# Patient Record
Sex: Female | Born: 1970 | Race: Black or African American | Hispanic: No | Marital: Married | Smoking: Never smoker
Health system: Southern US, Community
[De-identification: ages and names within clinical notes are randomized; demographics above are authoritative.]

## PROBLEM LIST (undated history)

## (undated) DIAGNOSIS — G4730 Sleep apnea, unspecified: Secondary | ICD-10-CM

## (undated) DIAGNOSIS — E282 Polycystic ovarian syndrome: Secondary | ICD-10-CM

## (undated) DIAGNOSIS — I10 Essential (primary) hypertension: Secondary | ICD-10-CM

## (undated) DIAGNOSIS — M1990 Unspecified osteoarthritis, unspecified site: Secondary | ICD-10-CM

## (undated) DIAGNOSIS — N943 Premenstrual tension syndrome: Secondary | ICD-10-CM

## (undated) DIAGNOSIS — M2550 Pain in unspecified joint: Secondary | ICD-10-CM

## (undated) DIAGNOSIS — R7303 Prediabetes: Secondary | ICD-10-CM

## (undated) HISTORY — DX: Pain in unspecified joint: M25.50

## (undated) HISTORY — PX: LEEP: SHX91

## (undated) HISTORY — DX: Unspecified osteoarthritis, unspecified site: M19.90

## (undated) HISTORY — PX: BREAST SURGERY: SHX581

## (undated) HISTORY — PX: COLONOSCOPY: SHX174

---

## 1998-10-17 ENCOUNTER — Other Ambulatory Visit: Admission: RE | Admit: 1998-10-17 | Discharge: 1998-10-17 | Payer: Self-pay | Admitting: Obstetrics & Gynecology

## 1999-02-06 ENCOUNTER — Encounter: Payer: Self-pay | Admitting: Emergency Medicine

## 1999-02-06 ENCOUNTER — Emergency Department (HOSPITAL_COMMUNITY): Admission: EM | Admit: 1999-02-06 | Discharge: 1999-02-06 | Payer: Self-pay | Admitting: Emergency Medicine

## 1999-11-20 ENCOUNTER — Other Ambulatory Visit: Admission: RE | Admit: 1999-11-20 | Discharge: 1999-11-20 | Payer: Self-pay | Admitting: Obstetrics & Gynecology

## 2000-12-01 ENCOUNTER — Other Ambulatory Visit: Admission: RE | Admit: 2000-12-01 | Discharge: 2000-12-01 | Payer: Self-pay | Admitting: Obstetrics & Gynecology

## 2001-02-24 ENCOUNTER — Other Ambulatory Visit: Admission: RE | Admit: 2001-02-24 | Discharge: 2001-02-24 | Payer: Self-pay | Admitting: Obstetrics & Gynecology

## 2001-10-27 ENCOUNTER — Other Ambulatory Visit: Admission: RE | Admit: 2001-10-27 | Discharge: 2001-10-27 | Payer: Self-pay | Admitting: Obstetrics and Gynecology

## 2002-11-03 ENCOUNTER — Other Ambulatory Visit: Admission: RE | Admit: 2002-11-03 | Discharge: 2002-11-03 | Payer: Self-pay | Admitting: *Deleted

## 2002-11-29 ENCOUNTER — Encounter: Payer: Self-pay | Admitting: Family Medicine

## 2002-11-29 ENCOUNTER — Ambulatory Visit (HOSPITAL_COMMUNITY): Admission: RE | Admit: 2002-11-29 | Discharge: 2002-11-29 | Payer: Self-pay | Admitting: Family Medicine

## 2003-11-07 ENCOUNTER — Other Ambulatory Visit: Admission: RE | Admit: 2003-11-07 | Discharge: 2003-11-07 | Payer: Self-pay | Admitting: *Deleted

## 2011-09-06 ENCOUNTER — Emergency Department (HOSPITAL_COMMUNITY)
Admission: EM | Admit: 2011-09-06 | Discharge: 2011-09-06 | Disposition: A | Discharge status: Home / Self Care | Payer: BC Managed Care – PPO | Attending: Emergency Medicine | Admitting: Emergency Medicine

## 2011-09-06 ENCOUNTER — Other Ambulatory Visit: Payer: Self-pay

## 2011-09-06 ENCOUNTER — Encounter: Payer: Self-pay | Admitting: Emergency Medicine

## 2011-09-06 DIAGNOSIS — R002 Palpitations: Secondary | ICD-10-CM | POA: Insufficient documentation

## 2011-09-06 DIAGNOSIS — I10 Essential (primary) hypertension: Secondary | ICD-10-CM | POA: Insufficient documentation

## 2011-09-06 HISTORY — DX: Polycystic ovarian syndrome: E28.2

## 2011-09-06 MED ORDER — ACETAMINOPHEN 325 MG PO TABS
975.0000 mg | ORAL_TABLET | Freq: Four times a day (QID) | ORAL | Status: DC | PRN
  Administered 2011-09-06: 975 mg via ORAL
  Filled 2011-09-06: qty 3

## 2011-09-06 NOTE — ED Provider Notes (Signed)
History     CSN: [id number] Arrival date & time: 09/06/2011 12:01 PM   First MD Initiated Contact with Patient 09/06/11 1253      Chief Complaint  Patient presents with  . Hypertension    123/101 @ home  . Palpitations     HPI Patient reports occasional palpitations x1 month.  She reports it feels like a "flutter".  His had no syncope or near-syncope.  She reports these are transient episodes.  Nothing seems to worsen it.  Nothing seems to bring bring on.  Denies herbal supplements.  She denies caffeine or other diet pills.  She denies any recent change in medications.  She's also noted to be hypertensive and her primary care doctor's office who asked her to recheck her blood pressure.  She reportedly was in the 140s over 100 range and thus comes the ER today for evaluation.  At this time she has no palpitations.  She has no chest pain shortness of breath.  She has no orthopnea or lower from edema.  She has no dyspnea on exertion.   Past Medical History  Diagnosis Date  . Diabetes mellitus     Pre diabetic  . PCOS (polycystic ovarian syndrome)     Past Surgical History  Procedure Date  . Breast surgery     Reduction    No family history on file.  History  Substance Use Topics  . Smoking status: Never Smoker   . Smokeless tobacco: Never Used  . Alcohol Use: Yes    OB History    Grav Para Term Preterm Abortions TAB SAB Ect Mult Living                  Review of Systems  All other systems reviewed and are negative.    Allergies  Glucophage and Oxycodone  Home Medications   Current Outpatient Rx  Name Route Sig Dispense Refill  . CALCIUM PO Oral Take 1 tablet by mouth daily.      Marland Kitchen FEXOFENADINE HCL 180 MG PO TABS Oral Take 180 mg by mouth daily.      Marland Kitchen FOLIC ACID PO Oral Take 1 tablet by mouth daily.      Carma Leaven M PLUS PO TABS Oral Take 1 tablet by mouth daily.      Marland Kitchen NORGESTIMATE-ETH ESTRADIOL 0.25-35 MG-MCG PO TABS Oral Take 1 tablet by mouth daily.          BP 144/95  Pulse 92  Temp(Src) 98.4 F (36.9 C) (Oral)  Resp 22  SpO2 99%  LMP 08/10/2011  Physical Exam  Nursing note and vitals reviewed. Constitutional: She is oriented to person, place, and time. She appears well-developed and well-nourished. No distress.  HENT:  Head: Normocephalic and atraumatic.  Eyes: EOM are normal.  Neck: Normal range of motion.  Cardiovascular: Normal rate, regular rhythm and normal heart sounds.   Pulmonary/Chest: Effort normal and breath sounds normal.  Abdominal: Soft. She exhibits no distension. There is no tenderness.  Musculoskeletal: Normal range of motion.  Neurological: She is alert and oriented to person, place, and time.  Skin: Skin is warm and dry.  Psychiatric: Judgment normal.       Anxious appearing    ED Course  Procedures (including critical care time)   ECG  Date: 09/06/2011  Rate: 72  Rhythm: normal sinus rhythm  QRS Axis: normal  Intervals: normal  ST/T Wave abnormalities: normal  Conduction Disutrbances:none  Narrative Interpretation:   Old EKG Reviewed: No  significant changes noted  Telemetry Interpretation: Normal sinus rhythm without ectopy.  I reviewed approximately 20 minutes of telemetry strips on the monitor   Labs Reviewed - No data to display No results found.   1. Palpitations       MDM  Palpitations with normal EKG and normal review of telemetry strips.  Suspect occasional PVCs not caught in the emergency department.  Blood pressure to be managed by her primary care physician and/or cardiologist.  I will refer the patient to cardiology        Lyanne Co, MD 09/06/11 1500

## 2011-09-06 NOTE — ED Notes (Signed)
Pt reports seen at Dr on Tuesday and Blood pressure was elevated. Pt was told to check BP at home. Pt reports palpitations x 1 month. Pt denies chest pain, N/V or dizziness.

## 2012-08-04 ENCOUNTER — Other Ambulatory Visit: Payer: Self-pay | Admitting: Radiology

## 2014-11-07 ENCOUNTER — Ambulatory Visit (INDEPENDENT_AMBULATORY_CARE_PROVIDER_SITE_OTHER): Payer: BLUE CROSS/BLUE SHIELD | Admitting: Podiatry

## 2014-11-07 ENCOUNTER — Encounter: Payer: Self-pay | Admitting: Podiatry

## 2014-11-07 VITALS — BP 137/67 | HR 74 | Resp 16

## 2014-11-07 DIAGNOSIS — B351 Tinea unguium: Secondary | ICD-10-CM | POA: Diagnosis not present

## 2014-11-07 NOTE — Progress Notes (Signed)
   Subjective:    Patient ID: Brittany Mills, female    DOB: January 04, 1971, 44 y.o.   MRN: [id number]  HPI Comments: "I have a toenail that is dark"  Patient c/o tender 1st and 2nd toenails right for several months. The nails are dark and thick. The 2nd nail keeps falling off. No home treatment.     Review of Systems  Skin:       Change in nails  All other systems reviewed and are negative.      Objective:   Physical Exam: I have reviewed past medical history medications allergy surgery social history and review of systems. Pulses are strongly palpable bilateral. Neurologic sensorium is intact for Semmes-Weinstein monofilament. Deep tendon reflexes are intact bilateral muscle strength +5 over 5 dorsiflexion plantar flexors and inverters everters onto the musculature is intact. Orthopedic evaluation and states all joints distal to the ankle, full range of motion without crepitation. Cutaneous evaluation of Mr. supple well-hydrated cutis nail dystrophy to the hallux left and second digit of the right foot demonstrates a nail avulsion more than likely secondary to trauma. The hallux right appears to have a nail dystrophy with onychomycosis distally as well as does the left.        Assessment & Plan:  Assessment: Probable onychomycosis with onychodystrophy.  Plan: Nail samples of the hallux bilaterally were taken today and we will send these for pathology. We will notify her once the path report returns.

## 2014-11-07 NOTE — Patient Instructions (Signed)

## 2014-11-23 ENCOUNTER — Encounter: Payer: Self-pay | Admitting: Podiatry

## 2014-11-30 ENCOUNTER — Ambulatory Visit (INDEPENDENT_AMBULATORY_CARE_PROVIDER_SITE_OTHER): Payer: BLUE CROSS/BLUE SHIELD | Admitting: Podiatry

## 2014-11-30 ENCOUNTER — Encounter: Payer: Self-pay | Admitting: Podiatry

## 2014-11-30 VITALS — BP 136/78 | HR 77 | Resp 16

## 2014-11-30 DIAGNOSIS — B351 Tinea unguium: Secondary | ICD-10-CM

## 2014-11-30 DIAGNOSIS — Z79899 Other long term (current) drug therapy: Secondary | ICD-10-CM

## 2014-11-30 LAB — HEPATIC FUNCTION PANEL
ALT: 23 U/L (ref 0–35)
AST: 17 U/L (ref 0–37)
Albumin: 3.7 g/dL (ref 3.5–5.2)
Alkaline Phosphatase: 36 U/L — ABNORMAL LOW (ref 39–117)
BILIRUBIN DIRECT: 0.1 mg/dL (ref 0.0–0.3)
BILIRUBIN INDIRECT: 0.2 mg/dL (ref 0.2–1.2)
BILIRUBIN TOTAL: 0.3 mg/dL (ref 0.2–1.2)
Total Protein: 6.4 g/dL (ref 6.0–8.3)

## 2014-11-30 MED ORDER — TERBINAFINE HCL 250 MG PO TABS: 250.0000 mg | ORAL_TABLET | Freq: Every day | ORAL | Status: DC

## 2014-11-30 NOTE — Patient Instructions (Signed)

## 2014-12-02 NOTE — Progress Notes (Signed)
She returns today for discussion of her pathology report which confirms onychomycosis to the bilateral toenails.  Objective: Onychomycosis bilateral.  Assessment: Onychomycosis.  Plan: Started her on Lamisil today 250 mg tablets 1 by mouth daily. We sent her out for a liver profile and CBC. Should this come back abnormal I'll notify her immediately. Otherwise I'll follow up with her in 1 month for her second dose of Lamisil.

## 2014-12-04 NOTE — Progress Notes (Signed)
Called patient-L/M- labs WNL and okay to continue medication.

## 2014-12-05 ENCOUNTER — Ambulatory Visit: Payer: BLUE CROSS/BLUE SHIELD | Admitting: Podiatry

## 2014-12-06 ENCOUNTER — Telehealth: Payer: Self-pay | Admitting: *Deleted

## 2014-12-06 NOTE — Telephone Encounter (Signed)
I called and informed the patient, Dr. Milinda Pointer said your bloodwork is good.  "Good, thank you."

## 2014-12-28 ENCOUNTER — Ambulatory Visit (INDEPENDENT_AMBULATORY_CARE_PROVIDER_SITE_OTHER): Payer: BLUE CROSS/BLUE SHIELD | Admitting: Podiatry

## 2014-12-28 ENCOUNTER — Encounter: Payer: Self-pay | Admitting: Podiatry

## 2014-12-28 VITALS — BP 136/65 | HR 73 | Resp 16

## 2014-12-28 DIAGNOSIS — B351 Tinea unguium: Secondary | ICD-10-CM | POA: Diagnosis not present

## 2014-12-28 LAB — HEPATIC FUNCTION PANEL
ALBUMIN: 4 g/dL (ref 3.5–5.2)
ALK PHOS: 38 U/L — AB (ref 39–117)
ALT: 21 U/L (ref 0–35)
AST: 17 U/L (ref 0–37)
BILIRUBIN DIRECT: 0.1 mg/dL (ref 0.0–0.3)
BILIRUBIN TOTAL: 0.3 mg/dL (ref 0.2–1.2)
Indirect Bilirubin: 0.2 mg/dL (ref 0.2–1.2)
TOTAL PROTEIN: 6.3 g/dL (ref 6.0–8.3)

## 2014-12-28 MED ORDER — FLUCONAZOLE 150 MG PO TABS: ORAL_TABLET | ORAL | Status: DC

## 2014-12-28 MED ORDER — TERBINAFINE HCL 250 MG PO TABS: 250.0000 mg | ORAL_TABLET | Freq: Every day | ORAL | Status: DC

## 2014-12-29 NOTE — Progress Notes (Signed)
She presents today for follow-up of her Lamisil therapy. She states that she's had a little bit of a rash on her belly on her forearms as well she says it really doesn't H but is just pops up there when she's been taking the Lamisil. She also states that she had a vaginal candidiasis from the medication as well. However I do feel that more than likely this was due to the antibiotics that she was taking prior to his placing her on Lamisil.  Objective: Vital signs are stable she's alert and oriented 3. Pulses are palpable and no nail plate changes as of yet.  Plan: Wrote her a prescription for Diflucan and a prescription for another 90 days of Lamisil. We also requested more blood work. I suggested that if this rash continues she should start to 50 mg of diphenhydramine daily. Should the rash worsened she will notify us immediately.

## 2014-12-29 NOTE — Progress Notes (Signed)
Called patient - informed her of normal labs.

## 2015-01-02 ENCOUNTER — Telehealth: Payer: Self-pay

## 2015-01-02 NOTE — Telephone Encounter (Signed)
Called pt to leave advise to continue with benadryl and wait 3 to 4 weeks for rash to resolve before starting back on the lamisil, left a voice mail for patient to return call

## 2015-05-03 ENCOUNTER — Ambulatory Visit: Payer: BLUE CROSS/BLUE SHIELD | Admitting: Podiatry

## 2015-05-03 ENCOUNTER — Ambulatory Visit (INDEPENDENT_AMBULATORY_CARE_PROVIDER_SITE_OTHER): Payer: BLUE CROSS/BLUE SHIELD | Admitting: Podiatry

## 2015-05-03 DIAGNOSIS — B351 Tinea unguium: Secondary | ICD-10-CM

## 2015-05-03 NOTE — Progress Notes (Signed)
Subjective:     Patient ID: Brittany Mills, female   DOB: [birth year], 44 y.o.   MRN: [id number]  HPIThis patient returns for her funfal infected great nails.  She has been treated by Dr. Milinda Pointer who determined she had positive fungal infection.  He treated her with lamisil which she turned out to be allergic.  Then she was written for Diflucan which she never took.  She says her right big toenail is painful at the cutucle and desires evaluation and treatment.   Review of Systems     Objective:   Physical Exam Objective: Review of past medical history, medications, social history and allergies were performed.  Vascular: Dorsalis pedis and posterior tibial pulses were palpable B/L, capillary refill was  WNL B/L, temperature gradient was WNL B/L   Skin:  No signs of symptoms of infection or ulcers on both feet  Nails: thick mycotic nails both hallux toenails  Sensory: Semmes Weinstein monifilament WNL   Orthopedic: Orthopedic evaluation demonstrates all joints distal t ankle have full ROM without crepitus, muscle power WNL B/L     Assessment:     Onychomycosis     Plan:     ROV.  Discussed with her about her fungal infected nails.  We decided to stop trying to treat her nails with medicine p.o.  Debrided the avulsed nail at distal aspect right hallux.

## 2015-07-19 ENCOUNTER — Other Ambulatory Visit: Payer: Self-pay | Admitting: Podiatry

## 2017-02-02 DIAGNOSIS — E282 Polycystic ovarian syndrome: Secondary | ICD-10-CM | POA: Diagnosis not present

## 2017-02-02 DIAGNOSIS — E669 Obesity, unspecified: Secondary | ICD-10-CM | POA: Diagnosis not present

## 2017-02-09 DIAGNOSIS — M2550 Pain in unspecified joint: Secondary | ICD-10-CM | POA: Diagnosis not present

## 2017-02-09 DIAGNOSIS — R635 Abnormal weight gain: Secondary | ICD-10-CM | POA: Diagnosis not present

## 2017-02-09 DIAGNOSIS — R21 Rash and other nonspecific skin eruption: Secondary | ICD-10-CM | POA: Diagnosis not present

## 2017-02-09 DIAGNOSIS — I10 Essential (primary) hypertension: Secondary | ICD-10-CM | POA: Diagnosis not present

## 2017-02-09 DIAGNOSIS — E669 Obesity, unspecified: Secondary | ICD-10-CM | POA: Diagnosis not present

## 2017-02-09 DIAGNOSIS — E282 Polycystic ovarian syndrome: Secondary | ICD-10-CM | POA: Diagnosis not present

## 2017-02-10 DIAGNOSIS — Z803 Family history of malignant neoplasm of breast: Secondary | ICD-10-CM | POA: Diagnosis not present

## 2017-02-10 DIAGNOSIS — Z09 Encounter for follow-up examination after completed treatment for conditions other than malignant neoplasm: Secondary | ICD-10-CM | POA: Diagnosis not present

## 2017-02-11 DIAGNOSIS — Z6841 Body Mass Index (BMI) 40.0 and over, adult: Secondary | ICD-10-CM | POA: Diagnosis not present

## 2017-02-11 DIAGNOSIS — Z01419 Encounter for gynecological examination (general) (routine) without abnormal findings: Secondary | ICD-10-CM | POA: Diagnosis not present

## 2017-02-11 DIAGNOSIS — Z1151 Encounter for screening for human papillomavirus (HPV): Secondary | ICD-10-CM | POA: Diagnosis not present

## 2017-02-11 DIAGNOSIS — N870 Mild cervical dysplasia: Secondary | ICD-10-CM | POA: Diagnosis not present

## 2017-03-04 DIAGNOSIS — N920 Excessive and frequent menstruation with regular cycle: Secondary | ICD-10-CM | POA: Diagnosis not present

## 2017-03-04 DIAGNOSIS — N946 Dysmenorrhea, unspecified: Secondary | ICD-10-CM | POA: Diagnosis not present

## 2017-03-04 DIAGNOSIS — D250 Submucous leiomyoma of uterus: Secondary | ICD-10-CM | POA: Diagnosis not present

## 2017-03-20 DIAGNOSIS — M1711 Unilateral primary osteoarthritis, right knee: Secondary | ICD-10-CM | POA: Diagnosis not present

## 2017-03-20 DIAGNOSIS — M25569 Pain in unspecified knee: Secondary | ICD-10-CM | POA: Diagnosis not present

## 2017-03-20 DIAGNOSIS — M25562 Pain in left knee: Secondary | ICD-10-CM | POA: Diagnosis not present

## 2017-03-20 DIAGNOSIS — R768 Other specified abnormal immunological findings in serum: Secondary | ICD-10-CM | POA: Diagnosis not present

## 2017-03-20 DIAGNOSIS — M25561 Pain in right knee: Secondary | ICD-10-CM | POA: Diagnosis not present

## 2017-03-20 DIAGNOSIS — M25529 Pain in unspecified elbow: Secondary | ICD-10-CM | POA: Diagnosis not present

## 2017-03-20 DIAGNOSIS — M7710 Lateral epicondylitis, unspecified elbow: Secondary | ICD-10-CM | POA: Diagnosis not present

## 2017-03-20 DIAGNOSIS — M545 Low back pain: Secondary | ICD-10-CM | POA: Diagnosis not present

## 2017-03-25 ENCOUNTER — Other Ambulatory Visit: Payer: Self-pay | Admitting: Obstetrics and Gynecology

## 2017-03-27 NOTE — Patient Instructions (Addendum)
Your procedure is scheduled on:  Thursday, June 21  Enter through the [employer] [facility] at: 10 AM  Pick up the phone at the desk and dial [phone].  Call this number if you have problems the morning of surgery:[phone].  Remember: Do NOT eat or drink (including water) after midnight Wednesday  Take these medicines the morning of surgery with a SIP OF WATER:  None  So not smoke on day of surgery.  Stop all herbal medications and supplements at this time.  Do NOT wear jewelry (body piercing), metal hair clips/bobby pins, make-up, or nail polish. Do NOT wear lotions, powders, or perfumes.  You may wear deoderant. Do NOT shave for 48 hours prior to surgery. Do NOT bring valuables to the hospital.  Have a responsible adult drive you home and stay with you for 24 hours after your procedure. Home with husband Brittany Mills cell [phone]

## 2017-04-01 ENCOUNTER — Encounter (HOSPITAL_COMMUNITY): Payer: Self-pay

## 2017-04-01 ENCOUNTER — Other Ambulatory Visit: Payer: Self-pay

## 2017-04-01 ENCOUNTER — Encounter (HOSPITAL_COMMUNITY)
Admission: RE | Admit: 2017-04-01 | Discharge: 2017-04-01 | Disposition: A | Discharge status: Home / Self Care | Payer: BLUE CROSS/BLUE SHIELD | Source: Ambulatory Visit | Attending: Obstetrics and Gynecology | Admitting: Obstetrics and Gynecology

## 2017-04-01 DIAGNOSIS — Z01818 Encounter for other preprocedural examination: Secondary | ICD-10-CM | POA: Diagnosis not present

## 2017-04-01 HISTORY — DX: Prediabetes: R73.03

## 2017-04-01 HISTORY — DX: Unspecified osteoarthritis, unspecified site: M19.90

## 2017-04-01 HISTORY — DX: Essential (primary) hypertension: I10

## 2017-04-01 HISTORY — DX: Premenstrual tension syndrome: N94.3

## 2017-04-01 LAB — CBC
HCT: 40.1 % (ref 36.0–46.0)
HEMOGLOBIN: 12.9 g/dL (ref 12.0–15.0)
MCH: 26.4 pg (ref 26.0–34.0)
MCHC: 32.2 g/dL (ref 30.0–36.0)
MCV: 82 fL (ref 78.0–100.0)
PLATELETS: 210 10*3/uL (ref 150–400)
RBC: 4.89 MIL/uL (ref 3.87–5.11)
RDW: 13.9 % (ref 11.5–15.5)
WBC: 6.7 10*3/uL (ref 4.0–10.5)

## 2017-04-01 LAB — BASIC METABOLIC PANEL
Anion gap: 6 (ref 5–15)
BUN: 15 mg/dL (ref 6–20)
CHLORIDE: 105 mmol/L (ref 101–111)
CO2: 26 mmol/L (ref 22–32)
Calcium: 9 mg/dL (ref 8.9–10.3)
Creatinine, Ser: 0.76 mg/dL (ref 0.44–1.00)
GFR calc Af Amer: >60 mL/min (ref >60)
GFR calc non Af Amer: >60 mL/min (ref >60)
Glucose, Bld: 74 mg/dL (ref 65–99)
POTASSIUM: 3.8 mmol/L (ref 3.5–5.1)
SODIUM: 137 mmol/L (ref 135–145)

## 2017-04-01 NOTE — Pre-Procedure Instructions (Signed)
Reviewed EKG with anesthesia Dr. Marcell Barlow.  No orders given. [place] for surgery.

## 2017-04-03 DIAGNOSIS — M545 Low back pain: Secondary | ICD-10-CM | POA: Diagnosis not present

## 2017-04-09 ENCOUNTER — Ambulatory Visit (HOSPITAL_COMMUNITY)
Admission: RE | Admit: 2017-04-09 | Discharge: 2017-04-09 | Disposition: A | Discharge status: Home / Self Care | Payer: BLUE CROSS/BLUE SHIELD | Source: Ambulatory Visit | Attending: Obstetrics and Gynecology | Admitting: Obstetrics and Gynecology

## 2017-04-09 ENCOUNTER — Encounter (HOSPITAL_COMMUNITY): Payer: Self-pay | Admitting: *Deleted

## 2017-04-09 ENCOUNTER — Ambulatory Visit (HOSPITAL_COMMUNITY): Payer: BLUE CROSS/BLUE SHIELD | Admitting: Anesthesiology

## 2017-04-09 ENCOUNTER — Encounter (HOSPITAL_COMMUNITY)
Admission: RE | Disposition: A | Discharge status: Home / Self Care | Payer: Self-pay | Source: Ambulatory Visit | Attending: Obstetrics and Gynecology

## 2017-04-09 DIAGNOSIS — D250 Submucous leiomyoma of uterus: Secondary | ICD-10-CM | POA: Diagnosis not present

## 2017-04-09 DIAGNOSIS — N920 Excessive and frequent menstruation with regular cycle: Secondary | ICD-10-CM | POA: Insufficient documentation

## 2017-04-09 DIAGNOSIS — M1990 Unspecified osteoarthritis, unspecified site: Secondary | ICD-10-CM | POA: Insufficient documentation

## 2017-04-09 DIAGNOSIS — E282 Polycystic ovarian syndrome: Secondary | ICD-10-CM | POA: Diagnosis not present

## 2017-04-09 DIAGNOSIS — Z885 Allergy status to narcotic agent status: Secondary | ICD-10-CM | POA: Diagnosis not present

## 2017-04-09 DIAGNOSIS — N946 Dysmenorrhea, unspecified: Secondary | ICD-10-CM | POA: Diagnosis not present

## 2017-04-09 DIAGNOSIS — I10 Essential (primary) hypertension: Secondary | ICD-10-CM | POA: Diagnosis not present

## 2017-04-09 DIAGNOSIS — R7303 Prediabetes: Secondary | ICD-10-CM | POA: Insufficient documentation

## 2017-04-09 DIAGNOSIS — Z91040 Latex allergy status: Secondary | ICD-10-CM | POA: Diagnosis not present

## 2017-04-09 HISTORY — PX: DILATATION & CURETTAGE/HYSTEROSCOPY WITH MYOSURE: SHX6511

## 2017-04-09 SURGERY — DILATATION & CURETTAGE/HYSTEROSCOPY WITH MYOSURE
Anesthesia: General

## 2017-04-09 MED ORDER — DEXAMETHASONE SODIUM PHOSPHATE 10 MG/ML IJ SOLN
INTRAMUSCULAR | Status: AC
  Filled 2017-04-09: qty 1

## 2017-04-09 MED ORDER — LACTATED RINGERS IR SOLN
Status: DC | PRN
  Administered 2017-04-09: 3000 mL

## 2017-04-09 MED ORDER — LIDOCAINE HCL (CARDIAC) 20 MG/ML IV SOLN
INTRAVENOUS | Status: AC
  Filled 2017-04-09: qty 5

## 2017-04-09 MED ORDER — PROPOFOL 10 MG/ML IV BOLUS
INTRAVENOUS | Status: DC | PRN
  Administered 2017-04-09: 200 mg via INTRAVENOUS

## 2017-04-09 MED ORDER — FENTANYL CITRATE (PF) 100 MCG/2ML IJ SOLN
INTRAMUSCULAR | Status: DC | PRN
Start: 2017-04-09 — End: 2017-04-09
  Administered 2017-04-09 (×2): 50 ug via INTRAVENOUS
  Administered 2017-04-09: 100 ug via INTRAVENOUS
  Administered 2017-04-09: 50 ug via INTRAVENOUS

## 2017-04-09 MED ORDER — LACTATED RINGERS IV SOLN: INTRAVENOUS | Status: DC

## 2017-04-09 MED ORDER — PROPOFOL 10 MG/ML IV BOLUS
INTRAVENOUS | Status: AC
  Filled 2017-04-09: qty 20

## 2017-04-09 MED ORDER — HYDROCODONE-ACETAMINOPHEN 5-325 MG PO TABS: 1.0000 | ORAL_TABLET | Freq: Four times a day (QID) | ORAL | 0 refills | Status: DC | PRN

## 2017-04-09 MED ORDER — GLYCOPYRROLATE 0.2 MG/ML IJ SOLN
INTRAMUSCULAR | Status: AC
  Filled 2017-04-09: qty 4

## 2017-04-09 MED ORDER — MIDAZOLAM HCL 2 MG/2ML IJ SOLN
INTRAMUSCULAR | Status: AC
  Filled 2017-04-09: qty 2

## 2017-04-09 MED ORDER — FENTANYL CITRATE (PF) 250 MCG/5ML IJ SOLN
INTRAMUSCULAR | Status: AC
  Filled 2017-04-09: qty 5

## 2017-04-09 MED ORDER — PROMETHAZINE HCL 25 MG/ML IJ SOLN: 6.2500 mg | INTRAMUSCULAR | Status: DC | PRN

## 2017-04-09 MED ORDER — NEOSTIGMINE METHYLSULFATE 10 MG/10ML IV SOLN
INTRAVENOUS | Status: AC
  Filled 2017-04-09: qty 1

## 2017-04-09 MED ORDER — MIDAZOLAM HCL 5 MG/5ML IJ SOLN
INTRAMUSCULAR | Status: DC | PRN
  Administered 2017-04-09: 2 mg via INTRAVENOUS

## 2017-04-09 MED ORDER — MEPERIDINE HCL 25 MG/ML IJ SOLN: 6.2500 mg | INTRAMUSCULAR | Status: DC | PRN

## 2017-04-09 MED ORDER — KETOROLAC TROMETHAMINE 30 MG/ML IJ SOLN
INTRAMUSCULAR | Status: AC
  Filled 2017-04-09: qty 1

## 2017-04-09 MED ORDER — FENTANYL CITRATE (PF) 100 MCG/2ML IJ SOLN: 25.0000 ug | INTRAMUSCULAR | Status: DC | PRN

## 2017-04-09 MED ORDER — LIDOCAINE HCL (CARDIAC) 20 MG/ML IV SOLN
INTRAVENOUS | Status: DC | PRN
  Administered 2017-04-09: 50 mg via INTRAVENOUS

## 2017-04-09 MED ORDER — DEXAMETHASONE SODIUM PHOSPHATE 10 MG/ML IJ SOLN
INTRAMUSCULAR | Status: DC | PRN
  Administered 2017-04-09: 4 mg via INTRAVENOUS

## 2017-04-09 MED ORDER — LACTATED RINGERS IV SOLN
INTRAVENOUS | Status: DC
  Administered 2017-04-09: 11:00:00 via INTRAVENOUS

## 2017-04-09 MED ORDER — KETOROLAC TROMETHAMINE 30 MG/ML IJ SOLN
INTRAMUSCULAR | Status: DC | PRN
  Administered 2017-04-09: 30 mg via INTRAVENOUS

## 2017-04-09 MED ORDER — SCOPOLAMINE 1 MG/3DAYS TD PT72
1.0000 | MEDICATED_PATCH | Freq: Once | TRANSDERMAL | Status: DC
  Administered 2017-04-09: 1.5 mg via TRANSDERMAL

## 2017-04-09 MED ORDER — ONDANSETRON HCL 4 MG/2ML IJ SOLN
INTRAMUSCULAR | Status: DC | PRN
  Administered 2017-04-09: 4 mg via INTRAVENOUS

## 2017-04-09 MED ORDER — SCOPOLAMINE 1 MG/3DAYS TD PT72
MEDICATED_PATCH | TRANSDERMAL | Status: AC
  Administered 2017-04-09: 1.5 mg via TRANSDERMAL
  Filled 2017-04-09: qty 1

## 2017-04-09 MED ORDER — ONDANSETRON HCL 4 MG/2ML IJ SOLN
INTRAMUSCULAR | Status: AC
  Filled 2017-04-09: qty 2

## 2017-04-09 SURGICAL SUPPLY — 18 items
CANISTER SUCT 3000ML PPV (MISCELLANEOUS) ×3 IMPLANT
CATH FOLEY LATEX FREE 14FR (CATHETERS) ×2
CATH FOLEY LF 14FR (CATHETERS) ×1 IMPLANT
CLOTH BEACON ORANGE TIMEOUT ST (SAFETY) ×3 IMPLANT
CONTAINER PREFILL 10% NBF 60ML (FORM) ×6 IMPLANT
DEVICE MYOSURE LITE (MISCELLANEOUS) IMPLANT
DEVICE MYOSURE REACH (MISCELLANEOUS) IMPLANT
FILTER ARTHROSCOPY CONVERTOR (FILTER) ×3 IMPLANT
GLOVE BIOGEL PI IND STRL 7.0 (GLOVE) ×2 IMPLANT
GLOVE BIOGEL PI INDICATOR 7.0 (GLOVE) ×4
GLOVE SURG SS PI 6.5 STRL IVOR (GLOVE) ×6 IMPLANT
GOWN STRL REUS W/TWL LRG LVL3 (GOWN DISPOSABLE) ×6 IMPLANT
PACK VAGINAL MINOR WOMEN LF (CUSTOM PROCEDURE TRAY) ×3 IMPLANT
PAD OB MATERNITY 4.3X12.25 (PERSONAL CARE ITEMS) ×3 IMPLANT
SEAL ROD LENS SCOPE MYOSURE (ABLATOR) ×3 IMPLANT
TOWEL OR 17X24 6PK STRL BLUE (TOWEL DISPOSABLE) ×6 IMPLANT
TUBING AQUILEX INFLOW (TUBING) ×3 IMPLANT
TUBING AQUILEX OUTFLOW (TUBING) ×3 IMPLANT

## 2017-04-09 NOTE — Anesthesia Preprocedure Evaluation (Addendum)
Anesthesia Evaluation  Patient identified by MRN, date of birth, ID band Patient awake    Reviewed: Allergy & Precautions, NPO status , Patient's Chart, lab work & pertinent test results  Airway Mallampati: III  TM Distance: >3 FB Neck ROM: Full    Dental  (+) Teeth Intact, Dental Advisory Given   Pulmonary neg pulmonary ROS,    breath sounds clear to auscultation       Cardiovascular hypertension, Pt. on medications negative cardio ROS   Rhythm:Regular Rate:Normal     Neuro/Psych negative neurological ROS  negative psych ROS   GI/Hepatic negative GI ROS, Neg liver ROS,   Endo/Other  negative endocrine ROS  Renal/GU negative Renal ROS  negative genitourinary   Musculoskeletal  (+) Arthritis , Osteoarthritis,    Abdominal (+) + obese,   Peds negative pediatric ROS (+)  Hematology negative hematology ROS (+)   Anesthesia Other Findings Day of surgery medications reviewed with the patient.  Reproductive/Obstetrics negative OB ROS                            Lab Results  Component Value Date   WBC 6.7 04/01/2017   HGB 12.9 04/01/2017   HCT 40.1 04/01/2017   MCV 82.0 04/01/2017   PLT 210 04/01/2017   Lab Results  Component Value Date   CREATININE 0.76 04/01/2017   BUN 15 04/01/2017   NA 137 04/01/2017   K 3.8 04/01/2017   CL 105 04/01/2017   CO2 26 04/01/2017   No results found for: INR, PROTIME  03/2017 EKG: NSR  Anesthesia Physical Anesthesia Plan  ASA: III  Anesthesia Plan: General   Post-op Pain Management:    Induction: Intravenous  PONV Risk Score and Plan: 4 or greater and Ondansetron, Dexamethasone, Propofol, Midazolam and Scopolamine patch - Pre-op  Airway Management Planned: LMA  Additional Equipment:   Intra-op Plan:   Post-operative Plan: Extubation in OR  Informed Consent: I have reviewed the patients History and Physical, chart, labs and discussed  the procedure including the risks, benefits and alternatives for the proposed anesthesia with the patient or authorized representative who has indicated his/her understanding and acceptance.   Dental advisory given  Plan Discussed with: CRNA  Anesthesia Plan Comments:         Anesthesia Quick Evaluation

## 2017-04-09 NOTE — Anesthesia Procedure Notes (Signed)
Procedure Name: LMA Insertion Date/Time: 04/09/2017 11:54 AM Performed by: Barkley Boards L Pre-anesthesia Checklist: Patient identified, Patient being monitored, Emergency Drugs available, Timeout performed and Suction available Patient Re-evaluated:Patient Re-evaluated prior to inductionOxygen Delivery Method: Circle System Utilized Preoxygenation: Pre-oxygenation with 100% oxygen Intubation Type: IV induction Ventilation: Mask ventilation without difficulty LMA: LMA inserted LMA Size: 4.0 Number of attempts: 1 Placement Confirmation: positive ETCO2 and breath sounds checked- equal and bilateral Dental Injury: Teeth and Oropharynx as per pre-operative assessment

## 2017-04-09 NOTE — Transfer of Care (Signed)
Immediate Anesthesia Transfer of Care Note  Patient: Brittany Mills  Procedure(s) Performed: Procedure(s): DILATATION & CURETTAGE/HYSTEROSCOPY WITH MYOSURE (N/A)  Patient Location: PACU  Anesthesia Type:General  Level of Consciousness: sedated  Airway & Oxygen Therapy: Patient Spontanous Breathing and Patient connected to nasal cannula oxygen  Post-op Assessment: Report given to RN and Post -op Vital signs reviewed and stable  Post vital signs: stable  Last Vitals:  Vitals:   04/09/17 1021  BP: (!) 131/92  Pulse: 68  Resp: 16  Temp: 36.9 C    Last Pain:  Vitals:   04/09/17 1021  TempSrc: Oral      Patients Stated Pain Goal: 3 (53/96/72 8979)  Complications: No apparent anesthesia complications

## 2017-04-09 NOTE — Op Note (Signed)
NAMEORETTA, BERKLAND               ACCOUNT NO.:  [id number]  MEDICAL RECORD NO.:  [id number]  LOCATION:  PERIO                         FACILITY:  [facility]  PHYSICIAN:  Servando Salina, M.D.DATE OF BIRTH:  [birth year]  DATE OF PROCEDURE:  04/09/2017 DATE OF DISCHARGE:                              OPERATIVE REPORT   PREOPERATIVE DIAGNOSES: 1. Submucosal fibroid. 2. Dysmenorrhea with menorrhagia.  PROCEDURES: 1. Diagnostic hysteroscopy. 2. Hysteroscopic resection of submucosal fibroid using MyoSure. 3. Dilation and curettage.  POSTOPERATIVE DIAGNOSES: 1. Submucosal fibroid. 2. Dysmenorrhea with menorrhagia.  ANESTHESIA:  General.  SURGEON:  Servando Salina, M.D.  ASSISTANT:  None.  DESCRIPTION OF PROCEDURE:  Under adequate general anesthesia, the patient was placed in dorsal lithotomy position.  She was sterilely prepped and draped in usual fashion.  The bladder was catheterized for moderate amount of urine.  Examination under anesthesia revealed anteverted uterus.  No adnexal masses could be appreciated.  Bivalve speculum was placed in the vagina.  Single-tooth tenaculum was placed on the anterior lip of the cervix.  The cervix was dilated up to #[facility] dilator.  Diagnostic hysteroscope was inserted into the uterine cavity. Both tubal ostia could be seen.  Posterior submucosal fibroid was noted. Using the extra blade MyoSure resectoscope, the fibroid was resected down to the level of the endometrium.  No other lesions were noted.  The resectoscope was removed.  The cavity was gently curetted for scant amount of tissue.  All instruments were then removed from the vagina.  SPECIMEN:  Labeled endometrial curetting with submucosal fibroid resections were sent to Pathology.  FLUID DEFICIT:  440 ml.  ESTIMATED BLOOD LOSS:  10 mL.  COMPLICATIONS:  None.  DISPOSITION:  The patient tolerated the procedure well, was transferred to recovery in stable  condition.     Servando Salina, M.D.     [place]/MEDQ  D:  04/09/2017  T:  04/09/2017  Job:  [id number]

## 2017-04-09 NOTE — Brief Op Note (Signed)
04/09/2017  12:30 PM  PATIENT:  Brittany Mills  46 y.o. female  PRE-OPERATIVE DIAGNOSIS:  Submucosal Fibroid, dysmenorrhea, menorrhagia with regular cycles  POST-OPERATIVE DIAGNOSIS:  Submucosal Fibroid, dysmenorrhea, menorrhagia with regular cycles  PROCEDURE:  Diagnostic hysteroscopy, dilation and curettage, hysteroscopic resection of SM fibroid  SURGEON:  Surgeon(s) and Role:    * Servando Salina, MD - Primary  PHYSICIAN ASSISTANT:   ASSISTANTS: none   ANESTHESIA:   general Findings: post SM fibroid, nl endocervical canal, tubal ostia seen EBL:  Total I/O In: 400 [I.V.:400] Out: 60 [Urine:50; Blood:10]  BLOOD ADMINISTERED:none  DRAINS: none   LOCAL MEDICATIONS USED:  NONE  SPECIMEN:  Source of Specimen:  EMC with fibroid resection  DISPOSITION OF SPECIMEN:  PATHOLOGY  COUNTS:  YES  TOURNIQUET:  * No tourniquets in log *  DICTATION: .Other Dictation: Dictation Number [id number]  PLAN OF CARE: Discharge to home after PACU  PATIENT DISPOSITION:  PACU - hemodynamically stable.   Delay start of Pharmacological VTE agent (>24hrs) due to surgical blood loss or risk of bleeding: no

## 2017-04-09 NOTE — Discharge Instructions (Addendum)
CALL  IF TEMP>100.4, NOTHING PER VAGINA X 2 WK, CALL IF SOAKING A MAXI  PAD EVERY HOUR OR MORE FREQUENT  DISCHARGE INSTRUCTIONS: HYSTEROSCOPY / ENDOMETRIAL ABLATION The following instructions have been prepared to help you care for yourself upon your return home.  May Remove Scop patch on or before  May take Ibuprofen after: 6:15 today  May take stool softner while taking narcotic pain medication to prevent constipation.  Drink plenty of water.  Personal hygiene:  Use sanitary pads for vaginal drainage, not tampons.  Shower the day after your procedure.  NO tub baths, pools or Jacuzzis for 2-3 weeks.  Wipe front to back after using the bathroom.  Activity and limitations:  Do NOT drive or operate any equipment for 24 hours. The effects of anesthesia are still present and drowsiness may result.  Do NOT rest in bed all day.  Walking is encouraged.  Walk up and down stairs slowly.  You may resume your normal activity in one to two days or as indicated by your physician. Sexual activity: NO intercourse for at least 2 weeks after the procedure, or as indicated by your Doctor.  Diet: Eat a light meal as desired this evening. You may resume your usual diet tomorrow.  Return to Work: You may resume your work activities in one to two days or as indicated by Marine scientist.  What to expect after your surgery: Expect to have vaginal bleeding/discharge for 2-3 days and spotting for up to 10 days. It is not unusual to have soreness for up to 1-2 weeks. You may have a slight burning sensation when you urinate for the first day. Mild cramps may continue for a couple of days. You may have a regular period in 2-6 weeks.  Call your doctor for any of the following:  Excessive vaginal bleeding or clotting, saturating and changing one pad every hour.  Inability to urinate 6 hours after discharge from hospital.  Pain not relieved by pain medication.  Fever of 100.4 F or greater.   Unusual vaginal discharge or odor.  Return to office _________________Call for an appointment ___________________ Patients signature: ______________________ Nurses signature ________________________  Post Anesthesia Care Unit [phone]  Post Anesthesia Home Care Instructions  Activity: Get plenty of rest for the remainder of the day. A responsible individual must stay with you for 24 hours following the procedure.  For the next 24 hours, DO NOT: -Drive a car -Paediatric nurse -Drink alcoholic beverages -Take any medication unless instructed by your physician -Make any legal decisions or sign important papers.  Meals: Start with liquid foods such as gelatin or soup. Progress to regular foods as tolerated. Avoid greasy, spicy, heavy foods. If nausea and/or vomiting occur, drink only clear liquids until the nausea and/or vomiting subsides. Call your physician if vomiting continues.  Special Instructions/Symptoms: Your throat may feel dry or sore from the anesthesia or the breathing tube placed in your throat during surgery. If this causes discomfort, gargle with warm salt water. The discomfort should disappear within 24 hours.  If you had a scopolamine patch placed behind your ear for the management of post- operative nausea and/or vomiting:  1. The medication in the patch is effective for 72 hours, after which it should be removed.  Wrap patch in a tissue and discard in the trash. Wash hands thoroughly with soap and water. 2. You may remove the patch earlier than 72 hours if you experience unpleasant side effects which may include dry mouth, dizziness or visual  disturbances. 3. Avoid touching the patch. Wash your hands with soap and water after contact with the patch.

## 2017-04-09 NOTE — H&P (Signed)
Brittany Mills is an 46 y.o. female G2P1011  MBF presents for surgical mgmt of SM fibroid noted on sonogram done for dysmenorrhea with heavy cycle  Pertinent Gynecological History: Menses: heavy Bleeding: menorrhagia Contraception: none DES exposure: denies Blood transfusions: none Sexually transmitted diseases: no past history Previous GYN Procedures: leep  Last mammogram: normal Date: 02/14/2017 Last pap: normal Date 02/11/2017 OB History: G2 P1011   Menstrual History: Menarche age: n/a Patient's last menstrual period was 04/04/2017 (exact date).    Past Medical History:  Diagnosis Date  . Arthritis    lower back - no meds  . Hypertension   . PCOS (polycystic ovarian syndrome)   . PMS (premenstrual syndrome)    tx prozac  . Pre-diabetes    no meds    Past Surgical History:  Procedure Laterality Date  . BREAST SURGERY     Reduction  . CESAREAN SECTION     x 1  . COLONOSCOPY     normal  . LEEP      History reviewed. No pertinent family history.  Social History:  reports that she has never smoked. She has never used smokeless tobacco. She reports that she drinks about 3.0 oz of alcohol per week . She reports that she does not use drugs.  Allergies:  Allergies  Allergen Reactions  . Glucophage [Metformin Hydrochloride] Rash  . Latex Rash    Slight rash on face  . Oxycodone Rash  . Terbinafine And Related Rash    Prescriptions Prior to Admission  Medication Sig Dispense Refill Last Dose  . Cholecalciferol (VITAMIN D3) 2000 units capsule Take 2,000 Units by mouth daily at 12 noon.  5 Past Week at Unknown time  . diclofenac sodium (VOLTAREN) 1 % GEL Apply 1 application topically 4 (four) times daily as needed for pain.  0   . FLUoxetine (PROZAC) 20 MG capsule Take 20 mg by mouth daily at 12 noon.  11 04/08/2017 at Unknown time  . folic acid (FOLVITE) 1 MG tablet Take 1 mg by mouth daily at 12 noon.   Past Week at Unknown time  . ibuprofen (ADVIL,MOTRIN) 800 MG  tablet Take 800 mg by mouth every 8 (eight) hours as needed for pain.  8 Past Week at Unknown time  . losartan-hydrochlorothiazide (HYZAAR) 100-25 MG tablet Take 1 tablet by mouth daily at 12 noon.  5 04/08/2017 at Unknown time  . Multiple Vitamins-Minerals (MULTIVITAMINS THER. W/MINERALS) TABS Take 1 tablet by mouth daily at 12 noon.    Past Week at Unknown time  . tiZANidine (ZANAFLEX) 4 MG tablet Take 2-4 mg by mouth every 6 (six) hours as needed for muscle spasms.       Review of Systems  Constitutional: Negative.     Blood pressure (!) 131/92, pulse 68, temperature 98.5 F (36.9 C), temperature source Oral, resp. rate 16, last menstrual period 04/04/2017, SpO2 99 %. Physical Exam  Constitutional: She appears well-developed and well-nourished.  HENT:  Head: Atraumatic.  Eyes: EOM are normal.  Neck: Normal range of motion. Neck supple.  Respiratory: Breath sounds normal.  GI: Soft.  Genitourinary: Vagina normal.  Genitourinary Comments: Vulva neg Vagina nl Cervix parous Uterus nl  Musculoskeletal: Normal range of motion.  Skin: Skin is warm and dry.  Psychiatric: She has a normal mood and affect.    No results found for this or any previous visit (from the past 24 hour(s)).  No results found.  Assessment/Plan: SM fibroid Dysmenorrhea with heavy cycles P) dx hysteroscopy,  D&C, resection of SM fibroid. Risk of surgery includes infection, bleeding, injury to surrounding organ structure, fluid overload and its consequences, inability to complete resection in one sitting. ALL ? answered  Brittany Mills A 04/09/2017, 10:58 AM

## 2017-04-10 ENCOUNTER — Encounter (HOSPITAL_COMMUNITY): Payer: Self-pay | Admitting: Obstetrics and Gynecology

## 2017-04-10 NOTE — Anesthesia Postprocedure Evaluation (Signed)
Anesthesia Post Note  Patient: Brittany Mills  Procedure(s) Performed: Procedure(s) (LRB): DILATATION & CURETTAGE/HYSTEROSCOPY WITH MYOSURE (N/A)     Patient location during evaluation: PACU Anesthesia Type: General Level of consciousness: awake and alert Pain management: pain level controlled Vital Signs Assessment: post-procedure vital signs reviewed and stable Respiratory status: spontaneous breathing, nonlabored ventilation, respiratory function stable and patient connected to nasal cannula oxygen Cardiovascular status: blood pressure returned to baseline and stable Postop Assessment: no signs of nausea or vomiting Anesthetic complications: no    Last Vitals:  Vitals:   04/09/17 1400 04/09/17 1457  BP: 127/76 (!) 144/82  Pulse: 64 64  Resp: 16   Temp: 36.7 C 36.7 C    Last Pain:  Vitals:   04/10/17 1643  TempSrc:   PainSc: 0-No pain                 Effie Berkshire

## 2017-08-04 DIAGNOSIS — I10 Essential (primary) hypertension: Secondary | ICD-10-CM | POA: Diagnosis not present

## 2017-08-04 DIAGNOSIS — E282 Polycystic ovarian syndrome: Secondary | ICD-10-CM | POA: Diagnosis not present

## 2017-08-04 DIAGNOSIS — R635 Abnormal weight gain: Secondary | ICD-10-CM | POA: Diagnosis not present

## 2017-08-11 DIAGNOSIS — R635 Abnormal weight gain: Secondary | ICD-10-CM | POA: Diagnosis not present

## 2017-08-11 DIAGNOSIS — Z23 Encounter for immunization: Secondary | ICD-10-CM | POA: Diagnosis not present

## 2017-08-11 DIAGNOSIS — I10 Essential (primary) hypertension: Secondary | ICD-10-CM | POA: Diagnosis not present

## 2017-08-11 DIAGNOSIS — E669 Obesity, unspecified: Secondary | ICD-10-CM | POA: Diagnosis not present

## 2017-08-11 DIAGNOSIS — E282 Polycystic ovarian syndrome: Secondary | ICD-10-CM | POA: Diagnosis not present

## 2017-08-13 DIAGNOSIS — Z118 Encounter for screening for other infectious and parasitic diseases: Secondary | ICD-10-CM | POA: Diagnosis not present

## 2017-08-13 DIAGNOSIS — N760 Acute vaginitis: Secondary | ICD-10-CM | POA: Diagnosis not present

## 2017-08-13 DIAGNOSIS — R300 Dysuria: Secondary | ICD-10-CM | POA: Diagnosis not present

## 2017-11-09 DIAGNOSIS — R300 Dysuria: Secondary | ICD-10-CM | POA: Diagnosis not present

## 2018-02-03 DIAGNOSIS — I10 Essential (primary) hypertension: Secondary | ICD-10-CM | POA: Diagnosis not present

## 2018-02-03 DIAGNOSIS — E282 Polycystic ovarian syndrome: Secondary | ICD-10-CM | POA: Diagnosis not present

## 2018-02-03 DIAGNOSIS — R635 Abnormal weight gain: Secondary | ICD-10-CM | POA: Diagnosis not present

## 2018-02-09 ENCOUNTER — Emergency Department (HOSPITAL_COMMUNITY)
Admission: EM | Admit: 2018-02-09 | Discharge: 2018-02-09 | Disposition: A | Discharge status: Home / Self Care | Payer: BLUE CROSS/BLUE SHIELD | Attending: Emergency Medicine | Admitting: Emergency Medicine

## 2018-02-09 ENCOUNTER — Encounter (HOSPITAL_COMMUNITY): Payer: Self-pay | Admitting: *Deleted

## 2018-02-09 ENCOUNTER — Other Ambulatory Visit: Payer: Self-pay

## 2018-02-09 DIAGNOSIS — Z79899 Other long term (current) drug therapy: Secondary | ICD-10-CM | POA: Insufficient documentation

## 2018-02-09 DIAGNOSIS — I10 Essential (primary) hypertension: Secondary | ICD-10-CM | POA: Diagnosis not present

## 2018-02-09 DIAGNOSIS — R51 Headache: Secondary | ICD-10-CM | POA: Diagnosis not present

## 2018-02-09 DIAGNOSIS — Z91040 Latex allergy status: Secondary | ICD-10-CM | POA: Diagnosis not present

## 2018-02-09 DIAGNOSIS — W19XXXA Unspecified fall, initial encounter: Secondary | ICD-10-CM

## 2018-02-09 NOTE — ED Provider Notes (Signed)
[facility] EMERGENCY DEPARTMENT Provider Note   CSN: [id number] Arrival date & time: 02/09/18  8527     History   Chief Complaint Chief Complaint  Patient presents with  . Fall    HPI Brittany Mills is a 47 y.o. female presenting to the ED with right-sided head contusion status post fall out of bed that occurred around 3 AM this morning.  Patient states she was sleeping and rolled out of bed hitting the right side of her head on her nightstand.  She states that immediately woke her.  She has had mild headache since that time with some tenderness behind ear where she hit her head.  Denies vision changes, nausea, vomiting, neck or back pain, or any other injuries.  Not on anticoagulation.  The history is provided by the patient.    Past Medical History:  Diagnosis Date  . Arthritis    lower back - no meds  . Hypertension   . PCOS (polycystic ovarian syndrome)   . PMS (premenstrual syndrome)    tx prozac  . Pre-diabetes    no meds    There are no active problems to display for this patient.   Past Surgical History:  Procedure Laterality Date  . BREAST SURGERY     Reduction  . CESAREAN SECTION     x 1  . COLONOSCOPY     normal  . DILATATION & CURETTAGE/HYSTEROSCOPY WITH MYOSURE N/A 04/09/2017   Procedure: DILATATION & CURETTAGE/HYSTEROSCOPY WITH MYOSURE;  Surgeon: Servando Salina, MD;  Location: [facility];  Service: Gynecology;  Laterality: N/A;  . LEEP       OB History   None      Home Medications    Prior to Admission medications   Medication Sig Start Date End Date Taking? Authorizing Provider  Cholecalciferol (VITAMIN D3) 2000 units capsule Take 2,000 Units by mouth daily at 12 noon. 02/09/17   [provider]  diclofenac sodium (VOLTAREN) 1 % GEL Apply 1 application topically 4 (four) times daily as needed for pain. 03/20/17   [provider]  FLUoxetine (PROZAC) 20 MG capsule Take 20 mg by mouth daily at 12 noon. 02/20/17    [provider]  folic acid (FOLVITE) 1 MG tablet Take 1 mg by mouth daily at 12 noon.    [provider]  HYDROcodone-acetaminophen (NORCO/VICODIN) 5-325 MG tablet Take 1-2 tablets by mouth every 6 (six) hours as needed for moderate pain. 04/09/17   Servando Salina, MD  ibuprofen (ADVIL,MOTRIN) 800 MG tablet Take 800 mg by mouth every 8 (eight) hours as needed for pain. 02/11/17   [provider]  losartan-hydrochlorothiazide (HYZAAR) 100-25 MG tablet Take 1 tablet by mouth daily at 12 noon. 02/13/17   [provider]  Multiple Vitamins-Minerals (MULTIVITAMINS THER. W/MINERALS) TABS Take 1 tablet by mouth daily at 12 noon.     [provider]  tiZANidine (ZANAFLEX) 4 MG tablet Take 2-4 mg by mouth every 6 (six) hours as needed for muscle spasms.    [provider]    Family History No family history on file.  Social History Social History   Tobacco Use  . Smoking status: Never Smoker  . Smokeless tobacco: Never Used  Substance Use Topics  . Alcohol use: Yes    Alcohol/week: 3.0 oz    Types: 5 Glasses of wine per week  . Drug use: No     Allergies   Glucophage [metformin hydrochloride]; Latex; Oxycodone; and Terbinafine and related  Review of Systems Review of Systems  Eyes: Negative for photophobia and visual disturbance.  Gastrointestinal: Negative for nausea and vomiting.  Musculoskeletal: Negative for back pain and neck pain.  Neurological: Positive for headaches. Negative for syncope.  Hematological: Does not bruise/bleed easily.  All other systems reviewed and are negative.    Physical Exam Updated Vital Signs BP 139/77 (BP Location: Right Arm)   Pulse 75   Temp 98.9 F (37.2 C) (Oral)   Resp 18   Ht 5\' 5"  (1.651 m)   SpO2 100%   BMI 48.80 kg/m   Physical Exam  Constitutional: She is oriented to person, place, and time. She appears well-developed and well-nourished. No distress.  HENT:  Head:  Normocephalic.  Small area of tenderness posterior and superior to right ear.  No significant hematoma or crepitus. No wounds. No battle sign.  TMs normal.  Eyes: Pupils are equal, round, and reactive to light. Conjunctivae and EOM are normal.  Neck: Normal range of motion.  Cardiovascular: Normal rate and intact distal pulses.  Pulmonary/Chest: Effort normal.  Neurological: She is alert and oriented to person, place, and time.  Mental Status:  Alert, oriented, thought content appropriate, able to give a coherent history. Speech fluent without evidence of aphasia. Able to follow 2 step commands without difficulty.  Cranial Nerves:  II:  Peripheral visual fields grossly normal, pupils equal, round, reactive to light III,IV, VI: ptosis not present, extra-ocular motions intact bilaterally  V,VII: smile symmetric, facial light touch sensation equal VIII: hearing grossly normal to voice  X: uvula elevates symmetrically  XI: bilateral shoulder shrug symmetric and strong XII: midline tongue extension without fassiculations Motor:  Normal tone. 5/5 in upper and lower extremities bilaterally including strong and equal grip strength and dorsiflexion/plantar flexion Sensory: Pinprick and light touch normal in all extremities.  Deep Tendon Reflexes: 2+ and symmetric in the biceps and patella Cerebellar: normal finger-to-nose with bilateral upper extremities Gait: normal gait and balance CV: distal pulses palpable throughout    Psychiatric: She has a normal mood and affect. Her behavior is normal.  Nursing note and vitals reviewed.    ED Treatments / Results  Labs (all labs ordered are listed, but only abnormal results are displayed) Labs Reviewed - No data to display  EKG None  Radiology No results found.  Procedures Procedures (including critical care time)  Medications Ordered in ED Medications - No data to display   Initial Impression / Assessment and Plan / ED Course  I have  reviewed the triage vital signs and the nursing notes.  Pertinent labs & imaging results that were available during my care of the patient were reviewed by me and considered in my medical decision making (see chart for details).     Pt presenting to the ED with minor contusion to head after rolling out of bed.  Mild headache, without vision changes, nausea or vomiting, or any other complaints.  Not on anticoagulation.  Normal neurologic exam.  Return for serious head trauma.  Discussed symptomatic management and PCP follow-up.  Safe for discharge.  Discussed results, findings, treatment and follow up. Patient advised of return precautions. Patient verbalized understanding and agreed with plan.  Final Clinical Impressions(s) / ED Diagnoses   Final diagnoses:  Fall, initial encounter    ED Discharge Orders    None       Akeia Perot, [place] N, PA-C 02/09/18 9470    Pixie Casino, MD 02/09/18 [phone]

## 2018-02-09 NOTE — Discharge Instructions (Addendum)
Please read instructions below. You can take tylenol every 4-6 hours as needed for headache. Apply ice to your head for 20 minutes at a time. Schedule an appointment with your primary care provider to follow up if symptoms persist. Return to the ER for severely worsening headache, vision changes, vomiting, or new or concerning symptoms.

## 2018-02-09 NOTE — ED Triage Notes (Signed)
To ED with complaint of falling out of bed and hitting head (behind right ear) on the nightstand. Pt states she was sleeping well when she rolled out of bed around 0300 this am. Slightly reddened area behind right ear. No open area. Slight HA. Pupils equal. Pt ambulatory without difficulty. No vomiting noted.

## 2018-02-10 DIAGNOSIS — I10 Essential (primary) hypertension: Secondary | ICD-10-CM | POA: Diagnosis not present

## 2018-02-10 DIAGNOSIS — E282 Polycystic ovarian syndrome: Secondary | ICD-10-CM | POA: Diagnosis not present

## 2018-02-10 DIAGNOSIS — R635 Abnormal weight gain: Secondary | ICD-10-CM | POA: Diagnosis not present

## 2018-02-10 DIAGNOSIS — E669 Obesity, unspecified: Secondary | ICD-10-CM | POA: Diagnosis not present

## 2018-02-11 DIAGNOSIS — Z1231 Encounter for screening mammogram for malignant neoplasm of breast: Secondary | ICD-10-CM | POA: Diagnosis not present

## 2018-02-11 DIAGNOSIS — Z853 Personal history of malignant neoplasm of breast: Secondary | ICD-10-CM | POA: Diagnosis not present

## 2018-02-16 DIAGNOSIS — Z6841 Body Mass Index (BMI) 40.0 and over, adult: Secondary | ICD-10-CM | POA: Diagnosis not present

## 2018-02-16 DIAGNOSIS — Z1151 Encounter for screening for human papillomavirus (HPV): Secondary | ICD-10-CM | POA: Diagnosis not present

## 2018-02-16 DIAGNOSIS — Z01419 Encounter for gynecological examination (general) (routine) without abnormal findings: Secondary | ICD-10-CM | POA: Diagnosis not present

## 2018-02-22 ENCOUNTER — Institutional Professional Consult (permissible substitution): Payer: BLUE CROSS/BLUE SHIELD | Admitting: Neurology

## 2018-02-25 ENCOUNTER — Encounter: Payer: Self-pay | Admitting: Cardiovascular Disease

## 2018-02-25 ENCOUNTER — Ambulatory Visit (INDEPENDENT_AMBULATORY_CARE_PROVIDER_SITE_OTHER): Payer: BLUE CROSS/BLUE SHIELD | Admitting: Cardiovascular Disease

## 2018-02-25 VITALS — BP 128/78 | HR 62 | Ht 65.0 in | Wt 301.0 lb

## 2018-02-25 DIAGNOSIS — I10 Essential (primary) hypertension: Secondary | ICD-10-CM

## 2018-02-25 DIAGNOSIS — R002 Palpitations: Secondary | ICD-10-CM

## 2018-02-25 NOTE — Progress Notes (Signed)
Cardiology Office Note   Date:  02/25/2018   ID:  Brittany Mills, DOB Nov 07, 1970, MRN [id number]  PCP:  Donald Prose, MD  Cardiologist:   Skeet Latch, MD  OB/GYN: Servando Salina, MD   Chief Complaint  Patient presents with  . New Patient (Initial Visit)    htn, family history of heart disease      History of Present Illness: Brittany Mills is a 47 y.o. female with hypertension and morbid obesity who is being seen today for the evaluation of hypertension at the request of Servando Salina, MD.  [name] had a routine evaluation on 02/2018.  At that time her blood pressure was 120/86.  She was referred to cardiology to establish care.  She was referred due to the recalls in ARBs.  She was first diagnosed with hypertension around age 63.  She has been on losartan/HCTZ since that time.  Her blood pressure has been easy to control.  She has not had any chest pain or shortness of breath.  She is trying to lose weight which is a struggle due to her PCOS.  She walks for 5 times per week for 30 to 45 minutes.  She just started with weight training.  She has no lower extremity edema, orthopnea, or PND.  She has known snoring and apnea at night.  She is scheduled for an outpatient sleep study.  She does have occasional palpitations that last for less than 1 minute at a time.  It typically occurs when lying in bed at night.  She has no exertional palpitations.  The palpitations are not associated with lightheadedness, dizziness, chest pain or shortness of breath.   Past Medical History:  Diagnosis Date  . Arthritis    lower back - no meds  . Hypertension   . PCOS (polycystic ovarian syndrome)   . PMS (premenstrual syndrome)    tx prozac  . Pre-diabetes    no meds    Past Surgical History:  Procedure Laterality Date  . BREAST SURGERY     Reduction  . CESAREAN SECTION     x 1  . COLONOSCOPY     normal  . DILATATION & CURETTAGE/HYSTEROSCOPY WITH MYOSURE N/A 04/09/2017   Procedure: DILATATION & CURETTAGE/HYSTEROSCOPY WITH MYOSURE;  Surgeon: Servando Salina, MD;  Location: [facility];  Service: Gynecology;  Laterality: N/A;  . LEEP       Current Outpatient Medications  Medication Sig Dispense Refill  . Cholecalciferol (VITAMIN D3) 2000 units capsule Take 2,000 Units by mouth daily at 12 noon.  5  . FLUoxetine (PROZAC) 20 MG capsule Take 20 mg by mouth daily at 12 noon.  11  . folic acid (FOLVITE) 1 MG tablet Take 1 mg by mouth daily at 12 noon.    Marland Kitchen ibuprofen (ADVIL,MOTRIN) 800 MG tablet Take 800 mg by mouth every 8 (eight) hours as needed for pain.  8  . losartan-hydrochlorothiazide (HYZAAR) 100-25 MG tablet Take 1 tablet by mouth daily at 12 noon.  5  . Multiple Vitamins-Minerals (MULTIVITAMINS THER. W/MINERALS) TABS Take 1 tablet by mouth daily at 12 noon.      No current facility-administered medications for this visit.     Allergies:   Glucophage [metformin hydrochloride]; Latex; Oxycodone; and Terbinafine and related    Social History:  The patient  reports that she has never smoked. She has never used smokeless tobacco. She reports that she drinks about 3.0 oz of alcohol per week. She reports that she does not  use drugs.   Family History:  The patient's family history includes Colon cancer in her father and maternal grandmother; Diabetes in her brother; Heart attack in her maternal grandfather; Heart failure in her mother; High blood pressure in her brother, brother, and mother; Hypertension in her mother and sister; Other in her mother.    ROS:  Please see the history of present illness.   Otherwise, review of systems are positive for none.   All other systems are reviewed and negative.    PHYSICAL EXAM: VS:  BP 128/78   Pulse 62   Ht 5\' 5"  (1.651 m)   Wt (!) 301 lb (136.5 kg)   BMI 50.09 kg/m  , BMI Body mass index is 50.09 kg/m. GENERAL:  Well appearing HEENT:  Pupils equal round and reactive, fundi not visualized, oral mucosa  unremarkable NECK:  No jugular venous distention, waveform within normal limits, carotid upstroke brisk and symmetric, no bruits LUNGS:  Clear to auscultation bilaterally HEART:  RRR.  PMI not displaced or sustained,S1 and S2 within normal limits, no S3, no S4, no clicks, no rubs, no murmurs ABD:  Flat, positive bowel sounds normal in frequency in pitch, no bruits, no rebound, no guarding, no midline pulsatile mass, no hepatomegaly, no splenomegaly EXT:  2 plus pulses throughout, no edema, no cyanosis no clubbing SKIN:  No rashes no nodules NEURO:  Cranial nerves II through XII grossly intact, motor grossly intact throughout PSYCH:  Cognitively intact, oriented to person place and time  EKG:  EKG is ordered today. The ekg ordered today demonstrates sinus rhythm.  Rate 62 bpm.  Left axis deviation.   Recent Labs: 04/01/2017: BUN 15; Creatinine, Ser 0.76; Hemoglobin 12.9; Platelets 210; Potassium 3.8; Sodium 137   02/25/2018: Sodium 138, potassium 4.4, BUN 14, creatinine 0.7 Hemoglobin A1c 5.2% TSH 0.92  08/04/2017:  Total cholesterol 151, triglycerides 57, HDL 64, LDL 76  Lipid Panel No results found for: CHOL, TRIG, HDL, CHOLHDL, VLDL, LDLCALC, LDLDIRECT    Wt Readings from Last 3 Encounters:  02/25/18 (!) 301 lb (136.5 kg)  04/01/17 293 lb 4 oz (133 kg)      ASSESSMENT AND PLAN:  # Hypertension:  [name] blood pressure is well controlled on her current regimen.  Although I would not typically start an ARB on an African-American as an initial agent, her blood pressure is controlled and she has not had any angioedema.  Therefore, we will not make any changes to her regimen at this time.  She has been able to obtain HCTZ/losartan tablets that are not affected by the recalls.  If she does have any issues in the future we will switch to HCTZ and amlodipine.  She was encouraged to continue exercise but make sure thatshe pushes herself and is unable to speak in full sentences while  walking.    # Palpitations: Sound like PACs/PVCs.  No concerning symptoms.  Electrolytes and thyroid were unremarkable when checked this year.   # Morbid obesity: Exercise as above.     Current medicines are reviewed at length with the patient today.  The patient does not have concerns regarding medicines.  The following changes have been made:  no change  Labs/ tests ordered today include:  No orders of the defined types were placed in this encounter.    Disposition:   FU with Coletta Lockner C. Oval Linsey, MD, [facility] as needed.       Signed, Steffen Hase C. Oval Linsey, MD, [facility]  02/25/2018 11:02 AM  [facility]

## 2018-02-25 NOTE — Patient Instructions (Signed)
Medication Instructions:  ?Your physician recommends that you continue on your current medications as directed. Please refer to the Current Medication list given to you today.  ? ?Labwork: ?NONE ? ?Testing/Procedures: ?NONE ? ?Follow-Up: ?AS NEEDED  ? ?  ?

## 2018-03-22 DIAGNOSIS — M25529 Pain in unspecified elbow: Secondary | ICD-10-CM | POA: Diagnosis not present

## 2018-03-22 DIAGNOSIS — R768 Other specified abnormal immunological findings in serum: Secondary | ICD-10-CM | POA: Diagnosis not present

## 2018-03-22 DIAGNOSIS — M7710 Lateral epicondylitis, unspecified elbow: Secondary | ICD-10-CM | POA: Diagnosis not present

## 2018-03-22 DIAGNOSIS — D8989 Other specified disorders involving the immune mechanism, not elsewhere classified: Secondary | ICD-10-CM | POA: Diagnosis not present

## 2018-03-23 DIAGNOSIS — M25572 Pain in left ankle and joints of left foot: Secondary | ICD-10-CM | POA: Diagnosis not present

## 2018-04-07 ENCOUNTER — Ambulatory Visit: Payer: BLUE CROSS/BLUE SHIELD | Admitting: Neurology

## 2018-04-07 ENCOUNTER — Encounter: Payer: Self-pay | Admitting: Neurology

## 2018-04-07 VITALS — BP 152/91 | HR 78 | Ht 65.0 in | Wt 298.0 lb

## 2018-04-07 DIAGNOSIS — R351 Nocturia: Secondary | ICD-10-CM | POA: Diagnosis not present

## 2018-04-07 DIAGNOSIS — Z820 Family history of epilepsy and other diseases of the nervous system: Secondary | ICD-10-CM

## 2018-04-07 DIAGNOSIS — Z6841 Body Mass Index (BMI) 40.0 and over, adult: Secondary | ICD-10-CM | POA: Diagnosis not present

## 2018-04-07 DIAGNOSIS — R0683 Snoring: Secondary | ICD-10-CM | POA: Diagnosis not present

## 2018-04-07 DIAGNOSIS — R51 Headache: Secondary | ICD-10-CM | POA: Diagnosis not present

## 2018-04-07 DIAGNOSIS — R519 Headache, unspecified: Secondary | ICD-10-CM

## 2018-04-07 DIAGNOSIS — R0681 Apnea, not elsewhere classified: Secondary | ICD-10-CM

## 2018-04-07 NOTE — Progress Notes (Signed)
Subjective:    Patient ID: Brittany Mills is a 47 y.o. female.  HPI     Star Age, MD, PhD [facility] [address] [place], [place] [address]  Dear Dr. Chalmers Cater,   I saw your patient, Brittany Mills, upon your kind request in my neurologic clinic today for initial consultation of her sleep disorder, in particular, concern for underlying obstructive sleep apnea. The patient is unaccompanied today. As you know, Brittany Mills is a 47 year old right-handed woman with an underlying medical history of PCOS, hypertension, arthritis, prediabetes, and morbid obesity, who reports snoring and sleep disruption. I reviewed your office note from 02/10/2018, which you kindly included. Her Epworth sleepiness score is 3 out of 24, fatigue score is 20 out of 63. She is married and lives with her husband, they have 1 child (daughter, 76). She is a nonsmoker and drinks alcohol occasionally, caffeine daily in the form of coffee, 2 cups per day on average. She does have a family history of sleep apnea, both her mother and her brother have CPAP machines. She would be willing to try CPAP therapy. Her husband has witnessed breathing pauses while she is asleep. Her snoring can be loud. She sleeps with earplugs because of her husband's snoring. She has a TV in the bedroom but does not keep it on at night, turns it off before falling asleep. Her bedtime is around 9 PM, rise time depending on her schedule. She tries to exercise in the morning and gets up at 3:30 for that, sometimes she sleeps till 5. Recently about 2 months ago she fell out of bed in the early morning hours as she was dreaming. She hit her head against the nightstand. She went to the emergency room. She was checked out but a CT was not done. She denies telltale symptoms of restless leg syndrome. She's not sure if she kicks her movements in her sleep. She is not sure if she talks in her sleep. She is a light sleeper and does  not typically wake up rested. She has had occasional morning headaches or rare morning headaches and has nocturia about once per average night. She is trying to lose weight, over the past year she has lost about 25 pounds thus far.  Her Past Medical History Is Significant For: Past Medical History:  Diagnosis Date  . Arthritis    lower back - no meds  . Hypertension   . PCOS (polycystic ovarian syndrome)   . PMS (premenstrual syndrome)    tx prozac  . Pre-diabetes    no meds    Her Past Surgical History Is Significant For: Past Surgical History:  Procedure Laterality Date  . BREAST SURGERY     Reduction  . CESAREAN SECTION     x 1  . COLONOSCOPY     normal  . DILATATION & CURETTAGE/HYSTEROSCOPY WITH MYOSURE N/A 04/09/2017   Procedure: DILATATION & CURETTAGE/HYSTEROSCOPY WITH MYOSURE;  Surgeon: Servando Salina, MD;  Location: [facility];  Service: Gynecology;  Laterality: N/A;  . LEEP      Her Family History Is Significant For: Family History  Problem Relation Age of Onset  . High blood pressure Mother   . Other Mother        pacemaker  . Heart failure Mother   . Hypertension Mother   . Colon cancer Father   . Hypertension Sister   . Diabetes Brother   . High blood pressure Brother   . Colon cancer  Maternal Grandmother   . Heart attack Maternal Grandfather   . High blood pressure Brother     Her Social History Is Significant For: Social History   Socioeconomic History  . Marital status: Married    Spouse name: Not on file  . Number of children: Not on file  . Years of education: Not on file  . Highest education level: Not on file  Occupational History  . Not on file  Social Needs  . Financial resource strain: Not on file  . Food insecurity:    Worry: Not on file    Inability: Not on file  . Transportation needs:    Medical: Not on file    Non-medical: Not on file  Tobacco Use  . Smoking status: Never Smoker  . Smokeless tobacco: Never Used  Substance  and Sexual Activity  . Alcohol use: Yes    Alcohol/week: 3.0 oz    Types: 5 Glasses of wine per week  . Drug use: No  . Sexual activity: Yes    Birth control/protection: Condom  Lifestyle  . Physical activity:    Days per week: Not on file    Minutes per session: Not on file  . Stress: Not on file  Relationships  . Social connections:    Talks on phone: Not on file    Gets together: Not on file    Attends religious service: Not on file    Active member of club or organization: Not on file    Attends meetings of clubs or organizations: Not on file    Relationship status: Not on file  Other Topics Concern  . Not on file  Social History Narrative  . Not on file    Her Allergies Are:  Allergies  Allergen Reactions  . Glucophage [Metformin Hydrochloride] Rash  . Latex Rash    Slight rash on face  . Oxycodone Rash  . Terbinafine And Related Rash  :   Her Current Medications Are:  Outpatient Encounter Medications as of 04/07/2018  Medication Sig  . Cholecalciferol (VITAMIN D3) 2000 units capsule Take 2,000 Units by mouth daily at 12 noon.  Marland Kitchen FLUoxetine (PROZAC) 20 MG capsule Take 20 mg by mouth daily at 12 noon.  . folic acid (FOLVITE) 1 MG tablet Take 1 mg by mouth daily at 12 noon.  Marland Kitchen ibuprofen (ADVIL,MOTRIN) 800 MG tablet Take 800 mg by mouth every 8 (eight) hours as needed for pain.  Marland Kitchen losartan-hydrochlorothiazide (HYZAAR) 100-25 MG tablet Take 1 tablet by mouth daily at 12 noon.  . Multiple Vitamins-Minerals (MULTIVITAMINS THER. W/MINERALS) TABS Take 1 tablet by mouth daily at 12 noon.    No facility-administered encounter medications on file as of 04/07/2018.   :  Review of Systems:  Out of a complete 14 point review of systems, all are reviewed and negative with the exception of these symptoms as listed below: Review of Systems  Neurological:       Pt presents today to discuss her sleep. Pt has never had a sleep study but does endorse snoring.  Epworth Sleepiness  Scale 0= would never doze 1= slight chance of dozing 2= moderate chance of dozing 3= high chance of dozing  Sitting and reading: 0 Watching TV: 2 Sitting inactive in a public place (ex. Theater or meeting): 0 As a passenger in a car for an hour without a break: 0 Lying down to rest in the afternoon: 1 Sitting and talking to someone: 0 Sitting quietly after lunch (no  alcohol): 0 In a car, while stopped in traffic: 0 Total: 3     Objective:  Neurological Exam  Physical Exam Physical Examination:   Vitals:   04/07/18 1116  BP: (!) 152/91  Pulse: 78    General Examination: The patient is a very pleasant 47 y.o. female in no acute distress. She appears well-developed and well-nourished and well groomed.   HEENT: Normocephalic, atraumatic, pupils are equal, round and reactive to light and accommodation. Funduscopic exam is normal with sharp disc margins noted. Extraocular tracking is good without limitation to gaze excursion or nystagmus noted. Normal smooth pursuit is noted. Hearing is grossly intact. Tympanic membranes are clear bilaterally. Face is symmetric with normal facial animation and normal facial sensation. Speech is clear with no dysarthria noted. There is no hypophonia. There is no lip, neck/head, jaw or voice tremor. Neck is supple with full range of passive and active motion. There are no carotid bruits on auscultation. Oropharynx exam reveals: mild mouth dryness, adequate dental hygiene and mild airway crowding, due to smaller airway entry. Mallampati is class I. Tongue protrudes centrally and palate elevates symmetrically. Tonsils are 1+ b/l. Neck size is 15 5/8 inches. She has a Mild overbite.  Chest: Clear to auscultation without wheezing, rhonchi or crackles noted.  Heart: S1+S2+0, regular and normal without murmurs, rubs or gallops noted.   Abdomen: Soft, non-tender and non-distended with normal bowel sounds appreciated on auscultation.  Extremities: There is no  pitting edema in the distal lower extremities bilaterally. Pedal pulses are intact.  Skin: Warm and dry without trophic changes noted.  Musculoskeletal: exam reveals no obvious joint deformities, tenderness or joint swelling or erythema.   Neurologically:  Mental status: The patient is awake, alert and oriented in all 4 spheres. Her immediate and remote memory, attention, language skills and fund of knowledge are appropriate. There is no evidence of aphasia, agnosia, apraxia or anomia. Speech is clear with normal prosody and enunciation. Thought process is linear. Mood is normal and affect is normal.  Cranial nerves II - XII are as described above under HEENT exam. In addition: shoulder shrug is normal with equal shoulder height noted. Motor exam: Normal bulk, strength and tone is noted. There is no drift, tremor or rebound. Romberg is negative. Reflexes are 1+ throughout. Fine motor skills and coordination: intact with normal finger taps, normal hand movements, normal rapid alternating patting, normal foot taps and normal foot agility.  Cerebellar testing: No dysmetria or intention tremor on finger to nose testing. Heel to shin is unremarkable bilaterally. There is no truncal or gait ataxia.  Sensory exam: intact to light touch in the upper and lower extremities.  Gait, station and balance: She stands easily. No veering to one side is noted. No leaning to one side is noted. Posture is age-appropriate and stance is narrow based. Gait shows normal stride length and normal pace. No problems turning are noted. Tandem walk is unremarkable. Intact toe and heel stance is noted.               Assessment and Plan:   In summary, Alsace Dowd is a very pleasant 47 y.o.-year old female with an underlying medical history of PCOS, hypertension, arthritis, prediabetes, and morbid obesity, whose history and physical exam are concerning for obstructive sleep apnea (OSA). I had a long chat with the patient about my  findings and the diagnosis of OSA, its prognosis and treatment options. We talked about medical treatments, surgical interventions and non-pharmacological approaches. I explained  in particular the risks and ramifications of untreated moderate to severe OSA, especially with respect to developing cardiovascular disease down the Road, including congestive heart failure, difficult to treat hypertension, cardiac arrhythmias, or stroke. Even type 2 diabetes has, in part, been linked to untreated OSA. Symptoms of untreated OSA include daytime sleepiness, memory problems, mood irritability and mood disorder such as depression and anxiety, lack of energy, as well as recurrent headaches, especially morning headaches. We talked about trying to maintain a healthy lifestyle in general, as well as the importance of weight control. I encouraged the patient to eat healthy, exercise daily and keep well hydrated, to keep a scheduled bedtime and wake time routine, to not skip any meals and eat healthy snacks in between meals. I advised the patient not to drive when feeling sleepy.  I recommended the following at this time: sleep study with potential positive airway pressure titration. (We will score hypopneas at 3%).   I explained the sleep test procedure to the patient and also outlined possible surgical and non-surgical treatment options of OSA, including the use of a custom-made dental device (which would require a referral to a specialist dentist or oral surgeon), upper airway surgical options, such as pillar implants, radiofrequency surgery, tongue base surgery, and UPPP (which would involve a referral to an ENT surgeon). Rarely, jaw surgery such as mandibular advancement may be considered.  I also explained the CPAP treatment option to the patient, who indicated that she would be willing to try CPAP if the need arises. I explained the importance of being compliant with PAP treatment, not only for insurance purposes but  primarily to improve Her symptoms, and for the patient's long term health benefit, including to reduce Her cardiovascular risks. I answered all her questions today and the patient was in agreement. I plan to see her back after the sleep study is completed and encouraged her to call with any interim questions, concerns, problems or updates.   Thank you very much for allowing me to participate in the care of this nice patient. If I can be of any further assistance to you please do not hesitate to call me at [phone].  Sincerely,   Star Age, MD, PhD

## 2018-04-07 NOTE — Patient Instructions (Addendum)

## 2018-04-09 ENCOUNTER — Ambulatory Visit: Payer: BLUE CROSS/BLUE SHIELD | Admitting: Cardiology

## 2018-05-07 ENCOUNTER — Ambulatory Visit (INDEPENDENT_AMBULATORY_CARE_PROVIDER_SITE_OTHER): Payer: BLUE CROSS/BLUE SHIELD | Admitting: Neurology

## 2018-05-07 DIAGNOSIS — G4733 Obstructive sleep apnea (adult) (pediatric): Secondary | ICD-10-CM

## 2018-05-07 DIAGNOSIS — R351 Nocturia: Secondary | ICD-10-CM

## 2018-05-07 DIAGNOSIS — R0681 Apnea, not elsewhere classified: Secondary | ICD-10-CM

## 2018-05-07 DIAGNOSIS — R519 Headache, unspecified: Secondary | ICD-10-CM

## 2018-05-07 DIAGNOSIS — Z6841 Body Mass Index (BMI) 40.0 and over, adult: Secondary | ICD-10-CM

## 2018-05-07 DIAGNOSIS — G4761 Periodic limb movement disorder: Secondary | ICD-10-CM

## 2018-05-07 DIAGNOSIS — R0683 Snoring: Secondary | ICD-10-CM

## 2018-05-07 DIAGNOSIS — Z820 Family history of epilepsy and other diseases of the nervous system: Secondary | ICD-10-CM

## 2018-05-07 DIAGNOSIS — R51 Headache: Secondary | ICD-10-CM

## 2018-05-12 ENCOUNTER — Telehealth: Payer: Self-pay

## 2018-05-12 DIAGNOSIS — J0100 Acute maxillary sinusitis, unspecified: Secondary | ICD-10-CM | POA: Diagnosis not present

## 2018-05-12 NOTE — Telephone Encounter (Signed)
I called pt to discuss her sleep study results. No answer, left a message asking her to call back.

## 2018-05-12 NOTE — Procedures (Signed)
PATIENT'S NAME:  Brittany Mills, Shamoon DOB:      [birth year]      MR#:    [id number]     DATE OF RECORDING: 05/07/2018 REFERRING M.D.:  Jacelyn Pi, MD Study Performed:   Baseline Polysomnogram HISTORY: 47 year old woman with a history of PCOS, hypertension, arthritis, prediabetes, and morbid obesity, who reports snoring and sleep disruption. The patient endorsed the Epworth Sleepiness Scale at 3/24 points. The patient's weight 298 pounds with a height of 65 (inches), resulting in a BMI of 49.6 kg/m2. The patient's neck circumference measured 15.5 inches.  CURRENT MEDICATIONS: Vitamin D, Prozac, Folvite, Advil, Hyzaar, Multivitamins.   PROCEDURE:  This is a multichannel digital polysomnogram utilizing the Somnostar 11.2 system.  Electrodes and sensors were applied and monitored per AASM Specifications.   EEG, EOG, Chin and Limb EMG, were sampled at 200 Hz.  ECG, Snore and Nasal Pressure, Thermal Airflow, Respiratory Effort, CPAP Flow and Pressure, Oximetry was sampled at 50 Hz. Digital video and audio were recorded.      BASELINE STUDY  Lights Out was at 22:37 and Lights On at 05:00.  Total recording time (TRT) was 383.5 minutes, with a total sleep time (TST) of  248 minutes.   The patient's sleep latency was 63 minutes, which is delayed. REM latency was 305 minutes, which is markedly delayed. The sleep efficiency was 64.7 %, which is reduced.     SLEEP ARCHITECTURE: WASO (Wake after sleep onset) was 91.5 minutes with mild sleep fragmentation noted and one longer period of wakefulness. There were 31 minutes in Stage N1, 185 minutes Stage N2, 22.5 minutes Stage N3 and 9.5 minutes in Stage REM.  The percentage of Stage N1 was 12.5%, which is increased, Stage N2 was 74.6%, which is increased, Stage N3 was 9.1% and Stage R (REM sleep) was 3.8%, which is markedly reduced. The arousals were noted as: 35 were spontaneous, 21 were associated with PLMs, 3 were associated with respiratory events.  RESPIRATORY  ANALYSIS:  There were a total of 33 respiratory events:  0 obstructive apneas, 0 central apneas and 0 mixed apneas with a total of 0 apneas and an apnea index (AI) of 0 /hour. There were 33 hypopneas with a hypopnea index of 8. /hour. The patient also had 0 respiratory event related arousals (RERAs).      The total APNEA/HYPOPNEA INDEX (AHI) was 8.0/hour and the total RESPIRATORY DISTURBANCE INDEX was 0. 8. /hour.  1 events occurred in REM sleep and 64 events in NREM. The REM AHI was 6.3 /hour, versus a non-REM AHI of 8.1. The patient spent 91.5 minutes of total sleep time in the supine position and 157 minutes in non-supine.. The supine AHI was 8.5 versus a non-supine AHI of 7.7.  OXYGEN SATURATION & C02:  The Wake baseline 02 saturation was 96%, with the lowest being 91%. Time spent below 89% saturation equaled 0 minutes.  PERIODIC LIMB MOVEMENTS:   The patient had a total of 81 Periodic Limb Movements.  The Periodic Limb Movement (PLM) index was 19.6 and the PLM Arousal index was 5.1/hour.  Audio and video analysis did not show any abnormal or unusual movements, behaviors, phonations or vocalizations. The patient took no bathroom breaks. Mild to moderate snoring was noted. The EKG was in keeping with normal sinus rhythm (NSR).  Post-study, the patient indicated that sleep was worse than usual.   IMPRESSION:  1. Mild Obstructive Sleep Apnea (OSA) 2. Periodic Limb Movement Disorder (PLMD)  RECOMMENDATIONS:  1. This study  demonstrates overall mild obstructive sleep apnea with a total AHI of 8/hour and O2 nadir of 91%. Treatment with PAP or autoPAP is not warranted, but can be considered if desired. Avoidance of supine sleep position along with weight loss will likely suffice as treatment. Other OSA treatment options may include in the right clinical setting: upper airway or jaw surgery in selected patients or the use of an oral appliance in certain patients. ENT evaluation and/or consultation  with a maxillofacial surgeon or dentist may be feasible in some instances.  2. Mild PLMs (periodic limb movements of sleep) were noted during this study with minimal arousals; clinical correlation is recommended. Medication effect from the antidepressant medication should be considered.  3. This study shows sleep fragmentation and abnormal sleep stage percentages; these are nonspecific findings and per se do not signify an intrinsic sleep disorder or a cause for the patient's sleep-related symptoms. Causes include (but are not limited to) the first night effect of the sleep study, circadian rhythm disturbances, medication effect or an underlying mood disorder or medical problem.  4. The patient should be cautioned not to drive, work at heights, or operate dangerous or heavy equipment when tired or sleepy. Review and reiteration of good sleep hygiene measures should be pursued with any patient. 5. The patient and her referring provider will be notified of the test results.  I certify that I have reviewed the entire raw data recording prior to the issuance of this report in accordance with the Standards of Accreditation of the American Academy of Sleep Medicine (AASM)   Star Age, MD, PhD Diplomat, American Board of Neurology and Sleep Medicine (Neurology and Sleep Medicine)

## 2018-05-12 NOTE — Telephone Encounter (Signed)
I called pt, explained her sleep study results. Pt declined auto pap therapy. Pt will pursue weight loss and avoidance of supine sleep. Pt will follow up with Dr. Chalmers Cater, and she asked that I send Dr. Chalmers Cater a copy of this study. Pt verbalized understanding of results. Pt had no questions at this time but was encouraged to call back if questions arise.

## 2018-05-12 NOTE — Telephone Encounter (Signed)
Pt returning RN's call.

## 2018-05-12 NOTE — Progress Notes (Signed)
Patient referred by Dr. Chalmers Cater, seen by me on 04/07/18, diagnostic PSG on 05/07/18.   Please call and notify the patient that the recent sleep study showed mild obstructive sleep apnea with a total AHI of 8/hour and O2 nadir of 91%. Treatment with PAP or autoPAP is not warranted, but can be considered if desired. Her O2 sats remained above 90%, which is good. Avoidance of supine sleep position along with weight loss will likely suffice as treatment. She may or may not be covered by her insurance for an autoPAP or CPAP, given AHI of less than 10/hour and ESS of less than 10/24.  She can FU with referring provider, we can try to submit for autoPAP if she wants to try it.   Thanks,  Star Age, MD, PhD [facility])

## 2018-05-12 NOTE — Telephone Encounter (Signed)
-----   Message from Star Age, MD sent at 05/12/2018  8:40 AM EDT ----- Patient referred by Dr. Chalmers Cater, seen by me on 04/07/18, diagnostic PSG on 05/07/18.   Please call and notify the patient that the recent sleep study showed mild obstructive sleep apnea with a total AHI of 8/hour and O2 nadir of 91%. Treatment with PAP or autoPAP is not warranted, but can be considered if desired. Her O2 sats remained above 90%, which is good. Avoidance of supine sleep position along with weight loss will likely suffice as treatment. She may or may not be covered by her insurance for an autoPAP or CPAP, given AHI of less than 10/hour and ESS of less than 10/24.  She can FU with referring provider, we can try to submit for autoPAP if she wants to try it.   Thanks,  Star Age, MD, PhD [facility]

## 2018-05-31 DIAGNOSIS — M25511 Pain in right shoulder: Secondary | ICD-10-CM | POA: Diagnosis not present

## 2018-05-31 DIAGNOSIS — M542 Cervicalgia: Secondary | ICD-10-CM | POA: Diagnosis not present

## 2018-06-07 DIAGNOSIS — M7541 Impingement syndrome of right shoulder: Secondary | ICD-10-CM | POA: Diagnosis not present

## 2018-06-07 DIAGNOSIS — M6281 Muscle weakness (generalized): Secondary | ICD-10-CM | POA: Diagnosis not present

## 2018-06-07 DIAGNOSIS — M25511 Pain in right shoulder: Secondary | ICD-10-CM | POA: Diagnosis not present

## 2018-06-07 DIAGNOSIS — M7551 Bursitis of right shoulder: Secondary | ICD-10-CM | POA: Diagnosis not present

## 2018-07-23 DIAGNOSIS — Z23 Encounter for immunization: Secondary | ICD-10-CM | POA: Diagnosis not present

## 2018-11-09 DIAGNOSIS — M25511 Pain in right shoulder: Secondary | ICD-10-CM | POA: Diagnosis not present

## 2018-12-29 DIAGNOSIS — M76821 Posterior tibial tendinitis, right leg: Secondary | ICD-10-CM | POA: Diagnosis not present

## 2019-02-04 DIAGNOSIS — R635 Abnormal weight gain: Secondary | ICD-10-CM | POA: Diagnosis not present

## 2019-02-04 DIAGNOSIS — E282 Polycystic ovarian syndrome: Secondary | ICD-10-CM | POA: Diagnosis not present

## 2019-02-11 DIAGNOSIS — E669 Obesity, unspecified: Secondary | ICD-10-CM | POA: Diagnosis not present

## 2019-02-11 DIAGNOSIS — I10 Essential (primary) hypertension: Secondary | ICD-10-CM | POA: Diagnosis not present

## 2019-02-11 DIAGNOSIS — R635 Abnormal weight gain: Secondary | ICD-10-CM | POA: Diagnosis not present

## 2019-02-11 DIAGNOSIS — E282 Polycystic ovarian syndrome: Secondary | ICD-10-CM | POA: Diagnosis not present

## 2019-02-21 DIAGNOSIS — Z113 Encounter for screening for infections with a predominantly sexual mode of transmission: Secondary | ICD-10-CM | POA: Diagnosis not present

## 2019-02-21 DIAGNOSIS — Z124 Encounter for screening for malignant neoplasm of cervix: Secondary | ICD-10-CM | POA: Diagnosis not present

## 2019-02-21 DIAGNOSIS — Z1159 Encounter for screening for other viral diseases: Secondary | ICD-10-CM | POA: Diagnosis not present

## 2019-02-21 DIAGNOSIS — Z118 Encounter for screening for other infectious and parasitic diseases: Secondary | ICD-10-CM | POA: Diagnosis not present

## 2019-02-21 DIAGNOSIS — Z114 Encounter for screening for human immunodeficiency virus [HIV]: Secondary | ICD-10-CM | POA: Diagnosis not present

## 2019-02-21 DIAGNOSIS — Z23 Encounter for immunization: Secondary | ICD-10-CM | POA: Diagnosis not present

## 2019-02-21 DIAGNOSIS — Z1151 Encounter for screening for human papillomavirus (HPV): Secondary | ICD-10-CM | POA: Diagnosis not present

## 2019-02-21 DIAGNOSIS — Z01419 Encounter for gynecological examination (general) (routine) without abnormal findings: Secondary | ICD-10-CM | POA: Diagnosis not present

## 2019-02-21 DIAGNOSIS — Z6841 Body Mass Index (BMI) 40.0 and over, adult: Secondary | ICD-10-CM | POA: Diagnosis not present

## 2019-03-09 DIAGNOSIS — Z803 Family history of malignant neoplasm of breast: Secondary | ICD-10-CM | POA: Diagnosis not present

## 2019-03-09 DIAGNOSIS — Z1231 Encounter for screening mammogram for malignant neoplasm of breast: Secondary | ICD-10-CM | POA: Diagnosis not present

## 2019-03-23 DIAGNOSIS — M2550 Pain in unspecified joint: Secondary | ICD-10-CM | POA: Diagnosis not present

## 2019-03-23 DIAGNOSIS — R768 Other specified abnormal immunological findings in serum: Secondary | ICD-10-CM | POA: Diagnosis not present

## 2019-03-23 DIAGNOSIS — M25529 Pain in unspecified elbow: Secondary | ICD-10-CM | POA: Diagnosis not present

## 2019-03-24 DIAGNOSIS — H1013 Acute atopic conjunctivitis, bilateral: Secondary | ICD-10-CM | POA: Diagnosis not present

## 2019-03-28 DIAGNOSIS — B309 Viral conjunctivitis, unspecified: Secondary | ICD-10-CM | POA: Diagnosis not present

## 2019-04-04 DIAGNOSIS — B309 Viral conjunctivitis, unspecified: Secondary | ICD-10-CM | POA: Diagnosis not present

## 2019-06-29 DIAGNOSIS — Z23 Encounter for immunization: Secondary | ICD-10-CM | POA: Diagnosis not present

## 2019-08-02 DIAGNOSIS — M25572 Pain in left ankle and joints of left foot: Secondary | ICD-10-CM | POA: Diagnosis not present

## 2019-08-08 DIAGNOSIS — E282 Polycystic ovarian syndrome: Secondary | ICD-10-CM | POA: Diagnosis not present

## 2019-08-08 DIAGNOSIS — I10 Essential (primary) hypertension: Secondary | ICD-10-CM | POA: Diagnosis not present

## 2019-08-12 DIAGNOSIS — E282 Polycystic ovarian syndrome: Secondary | ICD-10-CM | POA: Diagnosis not present

## 2019-08-12 DIAGNOSIS — I10 Essential (primary) hypertension: Secondary | ICD-10-CM | POA: Diagnosis not present

## 2019-08-12 DIAGNOSIS — R635 Abnormal weight gain: Secondary | ICD-10-CM | POA: Diagnosis not present

## 2019-08-12 DIAGNOSIS — E669 Obesity, unspecified: Secondary | ICD-10-CM | POA: Diagnosis not present

## 2019-09-02 DIAGNOSIS — Z1159 Encounter for screening for other viral diseases: Secondary | ICD-10-CM | POA: Diagnosis not present

## 2019-11-04 DIAGNOSIS — E282 Polycystic ovarian syndrome: Secondary | ICD-10-CM | POA: Diagnosis not present

## 2019-11-04 DIAGNOSIS — N926 Irregular menstruation, unspecified: Secondary | ICD-10-CM | POA: Diagnosis not present

## 2019-11-11 DIAGNOSIS — E669 Obesity, unspecified: Secondary | ICD-10-CM | POA: Diagnosis not present

## 2019-11-11 DIAGNOSIS — E282 Polycystic ovarian syndrome: Secondary | ICD-10-CM | POA: Diagnosis not present

## 2019-11-11 DIAGNOSIS — R635 Abnormal weight gain: Secondary | ICD-10-CM | POA: Diagnosis not present

## 2019-11-11 DIAGNOSIS — I10 Essential (primary) hypertension: Secondary | ICD-10-CM | POA: Diagnosis not present

## 2019-12-01 DIAGNOSIS — E282 Polycystic ovarian syndrome: Secondary | ICD-10-CM | POA: Diagnosis not present

## 2019-12-01 DIAGNOSIS — R7301 Impaired fasting glucose: Secondary | ICD-10-CM | POA: Diagnosis not present

## 2019-12-01 DIAGNOSIS — Z6841 Body Mass Index (BMI) 40.0 and over, adult: Secondary | ICD-10-CM | POA: Diagnosis not present

## 2019-12-23 DIAGNOSIS — H16223 Keratoconjunctivitis sicca, not specified as Sjogren's, bilateral: Secondary | ICD-10-CM | POA: Diagnosis not present

## 2019-12-23 DIAGNOSIS — H0288A Meibomian gland dysfunction right eye, upper and lower eyelids: Secondary | ICD-10-CM | POA: Diagnosis not present

## 2019-12-23 DIAGNOSIS — H0288B Meibomian gland dysfunction left eye, upper and lower eyelids: Secondary | ICD-10-CM | POA: Diagnosis not present

## 2020-03-07 DIAGNOSIS — Z6841 Body Mass Index (BMI) 40.0 and over, adult: Secondary | ICD-10-CM | POA: Diagnosis not present

## 2020-03-07 DIAGNOSIS — Z1151 Encounter for screening for human papillomavirus (HPV): Secondary | ICD-10-CM | POA: Diagnosis not present

## 2020-03-07 DIAGNOSIS — Z01419 Encounter for gynecological examination (general) (routine) without abnormal findings: Secondary | ICD-10-CM | POA: Diagnosis not present

## 2020-03-09 DIAGNOSIS — I10 Essential (primary) hypertension: Secondary | ICD-10-CM | POA: Diagnosis not present

## 2020-03-09 DIAGNOSIS — R7301 Impaired fasting glucose: Secondary | ICD-10-CM | POA: Diagnosis not present

## 2020-03-16 DIAGNOSIS — R635 Abnormal weight gain: Secondary | ICD-10-CM | POA: Diagnosis not present

## 2020-03-16 DIAGNOSIS — N951 Menopausal and female climacteric states: Secondary | ICD-10-CM | POA: Diagnosis not present

## 2020-03-16 DIAGNOSIS — R5382 Chronic fatigue, unspecified: Secondary | ICD-10-CM | POA: Diagnosis not present

## 2020-03-16 DIAGNOSIS — R7301 Impaired fasting glucose: Secondary | ICD-10-CM | POA: Diagnosis not present

## 2020-03-22 DIAGNOSIS — N644 Mastodynia: Secondary | ICD-10-CM | POA: Diagnosis not present

## 2020-03-22 DIAGNOSIS — R928 Other abnormal and inconclusive findings on diagnostic imaging of breast: Secondary | ICD-10-CM | POA: Diagnosis not present

## 2020-03-26 DIAGNOSIS — Z1331 Encounter for screening for depression: Secondary | ICD-10-CM | POA: Diagnosis not present

## 2020-03-26 DIAGNOSIS — R635 Abnormal weight gain: Secondary | ICD-10-CM | POA: Diagnosis not present

## 2020-03-26 DIAGNOSIS — M25542 Pain in joints of left hand: Secondary | ICD-10-CM | POA: Diagnosis not present

## 2020-03-26 DIAGNOSIS — M5136 Other intervertebral disc degeneration, lumbar region: Secondary | ICD-10-CM | POA: Diagnosis not present

## 2020-03-26 DIAGNOSIS — R7303 Prediabetes: Secondary | ICD-10-CM | POA: Diagnosis not present

## 2020-03-26 DIAGNOSIS — M533 Sacrococcygeal disorders, not elsewhere classified: Secondary | ICD-10-CM | POA: Diagnosis not present

## 2020-03-26 DIAGNOSIS — M25541 Pain in joints of right hand: Secondary | ICD-10-CM | POA: Diagnosis not present

## 2020-03-26 DIAGNOSIS — I10 Essential (primary) hypertension: Secondary | ICD-10-CM | POA: Diagnosis not present

## 2020-03-26 DIAGNOSIS — E282 Polycystic ovarian syndrome: Secondary | ICD-10-CM | POA: Diagnosis not present

## 2020-03-26 DIAGNOSIS — M549 Dorsalgia, unspecified: Secondary | ICD-10-CM | POA: Diagnosis not present

## 2020-03-26 DIAGNOSIS — R768 Other specified abnormal immunological findings in serum: Secondary | ICD-10-CM | POA: Diagnosis not present

## 2020-03-26 DIAGNOSIS — M2550 Pain in unspecified joint: Secondary | ICD-10-CM | POA: Diagnosis not present

## 2020-03-26 DIAGNOSIS — M79641 Pain in right hand: Secondary | ICD-10-CM | POA: Diagnosis not present

## 2020-03-26 DIAGNOSIS — Z1339 Encounter for screening examination for other mental health and behavioral disorders: Secondary | ICD-10-CM | POA: Diagnosis not present

## 2020-03-26 DIAGNOSIS — M79642 Pain in left hand: Secondary | ICD-10-CM | POA: Diagnosis not present

## 2020-03-27 DIAGNOSIS — H0288A Meibomian gland dysfunction right eye, upper and lower eyelids: Secondary | ICD-10-CM | POA: Diagnosis not present

## 2020-03-27 DIAGNOSIS — H16223 Keratoconjunctivitis sicca, not specified as Sjogren's, bilateral: Secondary | ICD-10-CM | POA: Diagnosis not present

## 2020-03-27 DIAGNOSIS — H16143 Punctate keratitis, bilateral: Secondary | ICD-10-CM | POA: Diagnosis not present

## 2020-03-27 DIAGNOSIS — H18422 Band keratopathy, left eye: Secondary | ICD-10-CM | POA: Diagnosis not present

## 2020-04-12 DIAGNOSIS — Z6841 Body Mass Index (BMI) 40.0 and over, adult: Secondary | ICD-10-CM | POA: Diagnosis not present

## 2020-04-12 DIAGNOSIS — E282 Polycystic ovarian syndrome: Secondary | ICD-10-CM | POA: Diagnosis not present

## 2020-05-14 DIAGNOSIS — Z6841 Body Mass Index (BMI) 40.0 and over, adult: Secondary | ICD-10-CM | POA: Diagnosis not present

## 2020-05-14 DIAGNOSIS — R7301 Impaired fasting glucose: Secondary | ICD-10-CM | POA: Diagnosis not present

## 2020-05-14 DIAGNOSIS — I10 Essential (primary) hypertension: Secondary | ICD-10-CM | POA: Diagnosis not present

## 2020-05-28 DIAGNOSIS — I10 Essential (primary) hypertension: Secondary | ICD-10-CM | POA: Diagnosis not present

## 2020-05-28 DIAGNOSIS — Z6841 Body Mass Index (BMI) 40.0 and over, adult: Secondary | ICD-10-CM | POA: Diagnosis not present

## 2020-05-28 DIAGNOSIS — R7301 Impaired fasting glucose: Secondary | ICD-10-CM | POA: Diagnosis not present

## 2020-05-30 DIAGNOSIS — R221 Localized swelling, mass and lump, neck: Secondary | ICD-10-CM | POA: Diagnosis not present

## 2020-05-31 ENCOUNTER — Other Ambulatory Visit: Payer: Self-pay | Admitting: Family Medicine

## 2020-05-31 DIAGNOSIS — R221 Localized swelling, mass and lump, neck: Secondary | ICD-10-CM

## 2020-06-01 ENCOUNTER — Ambulatory Visit
Admission: RE | Admit: 2020-06-01 | Discharge: 2020-06-01 | Disposition: A | Discharge status: Home / Self Care | Payer: BC Managed Care – PPO | Source: Ambulatory Visit | Attending: Family Medicine | Admitting: Family Medicine

## 2020-06-01 DIAGNOSIS — L270 Generalized skin eruption due to drugs and medicaments taken internally: Secondary | ICD-10-CM | POA: Diagnosis not present

## 2020-06-01 DIAGNOSIS — R221 Localized swelling, mass and lump, neck: Secondary | ICD-10-CM | POA: Diagnosis not present

## 2020-06-04 DIAGNOSIS — Z6841 Body Mass Index (BMI) 40.0 and over, adult: Secondary | ICD-10-CM | POA: Diagnosis not present

## 2020-06-04 DIAGNOSIS — H16223 Keratoconjunctivitis sicca, not specified as Sjogren's, bilateral: Secondary | ICD-10-CM | POA: Diagnosis not present

## 2020-06-04 DIAGNOSIS — I10 Essential (primary) hypertension: Secondary | ICD-10-CM | POA: Diagnosis not present

## 2020-06-11 DIAGNOSIS — R7301 Impaired fasting glucose: Secondary | ICD-10-CM | POA: Diagnosis not present

## 2020-06-11 DIAGNOSIS — Z6841 Body Mass Index (BMI) 40.0 and over, adult: Secondary | ICD-10-CM | POA: Diagnosis not present

## 2020-06-18 DIAGNOSIS — R7301 Impaired fasting glucose: Secondary | ICD-10-CM | POA: Diagnosis not present

## 2020-06-18 DIAGNOSIS — Z6841 Body Mass Index (BMI) 40.0 and over, adult: Secondary | ICD-10-CM | POA: Diagnosis not present

## 2020-06-26 DIAGNOSIS — I10 Essential (primary) hypertension: Secondary | ICD-10-CM | POA: Diagnosis not present

## 2020-06-26 DIAGNOSIS — Z6841 Body Mass Index (BMI) 40.0 and over, adult: Secondary | ICD-10-CM | POA: Diagnosis not present

## 2020-06-26 DIAGNOSIS — R7301 Impaired fasting glucose: Secondary | ICD-10-CM | POA: Diagnosis not present

## 2020-07-02 DIAGNOSIS — Z6841 Body Mass Index (BMI) 40.0 and over, adult: Secondary | ICD-10-CM | POA: Diagnosis not present

## 2020-07-02 DIAGNOSIS — I10 Essential (primary) hypertension: Secondary | ICD-10-CM | POA: Diagnosis not present

## 2020-07-09 DIAGNOSIS — I10 Essential (primary) hypertension: Secondary | ICD-10-CM | POA: Diagnosis not present

## 2020-07-09 DIAGNOSIS — Z6841 Body Mass Index (BMI) 40.0 and over, adult: Secondary | ICD-10-CM | POA: Diagnosis not present

## 2020-07-16 DIAGNOSIS — Z6841 Body Mass Index (BMI) 40.0 and over, adult: Secondary | ICD-10-CM | POA: Diagnosis not present

## 2020-07-16 DIAGNOSIS — E282 Polycystic ovarian syndrome: Secondary | ICD-10-CM | POA: Diagnosis not present

## 2020-07-23 DIAGNOSIS — R7301 Impaired fasting glucose: Secondary | ICD-10-CM | POA: Diagnosis not present

## 2020-07-23 DIAGNOSIS — N951 Menopausal and female climacteric states: Secondary | ICD-10-CM | POA: Diagnosis not present

## 2020-07-23 DIAGNOSIS — I10 Essential (primary) hypertension: Secondary | ICD-10-CM | POA: Diagnosis not present

## 2020-07-23 DIAGNOSIS — Z6841 Body Mass Index (BMI) 40.0 and over, adult: Secondary | ICD-10-CM | POA: Diagnosis not present

## 2020-07-23 DIAGNOSIS — R5382 Chronic fatigue, unspecified: Secondary | ICD-10-CM | POA: Diagnosis not present

## 2020-07-30 DIAGNOSIS — R7301 Impaired fasting glucose: Secondary | ICD-10-CM | POA: Diagnosis not present

## 2020-07-30 DIAGNOSIS — Z6841 Body Mass Index (BMI) 40.0 and over, adult: Secondary | ICD-10-CM | POA: Diagnosis not present

## 2020-07-30 DIAGNOSIS — I10 Essential (primary) hypertension: Secondary | ICD-10-CM | POA: Diagnosis not present

## 2020-08-13 DIAGNOSIS — Z6841 Body Mass Index (BMI) 40.0 and over, adult: Secondary | ICD-10-CM | POA: Diagnosis not present

## 2020-08-13 DIAGNOSIS — R7301 Impaired fasting glucose: Secondary | ICD-10-CM | POA: Diagnosis not present

## 2020-08-27 DIAGNOSIS — I10 Essential (primary) hypertension: Secondary | ICD-10-CM | POA: Diagnosis not present

## 2020-08-27 DIAGNOSIS — Z6841 Body Mass Index (BMI) 40.0 and over, adult: Secondary | ICD-10-CM | POA: Diagnosis not present

## 2020-08-29 DIAGNOSIS — I10 Essential (primary) hypertension: Secondary | ICD-10-CM | POA: Diagnosis not present

## 2020-08-29 DIAGNOSIS — N926 Irregular menstruation, unspecified: Secondary | ICD-10-CM | POA: Diagnosis not present

## 2020-08-29 DIAGNOSIS — E282 Polycystic ovarian syndrome: Secondary | ICD-10-CM | POA: Diagnosis not present

## 2020-09-01 ENCOUNTER — Other Ambulatory Visit: Payer: Self-pay

## 2020-09-01 ENCOUNTER — Ambulatory Visit: Payer: BC Managed Care – PPO | Attending: Internal Medicine

## 2020-09-01 DIAGNOSIS — Z23 Encounter for immunization: Secondary | ICD-10-CM

## 2020-09-01 NOTE — Progress Notes (Signed)
   Covid-19 Vaccination Clinic  Name:  Brittany Mills    MRN: [id number] DOB: 11/03/1970  09/01/2020  [name] was observed post Covid-19 immunization for 15 minutes without incident. She was provided with Vaccine Information Sheet and instruction to access the V-Safe system.   [name] was instructed to call 911 with any severe reactions post vaccine: Marland Kitchen Difficulty breathing  . Swelling of face and throat  . A fast heartbeat  . A bad rash all over body  . Dizziness and weakness

## 2020-09-03 DIAGNOSIS — R7301 Impaired fasting glucose: Secondary | ICD-10-CM | POA: Diagnosis not present

## 2020-09-03 DIAGNOSIS — Z6841 Body Mass Index (BMI) 40.0 and over, adult: Secondary | ICD-10-CM | POA: Diagnosis not present

## 2020-09-03 DIAGNOSIS — I10 Essential (primary) hypertension: Secondary | ICD-10-CM | POA: Diagnosis not present

## 2020-09-05 DIAGNOSIS — I10 Essential (primary) hypertension: Secondary | ICD-10-CM | POA: Diagnosis not present

## 2020-09-05 DIAGNOSIS — R7301 Impaired fasting glucose: Secondary | ICD-10-CM | POA: Diagnosis not present

## 2020-09-17 DIAGNOSIS — R7301 Impaired fasting glucose: Secondary | ICD-10-CM | POA: Diagnosis not present

## 2020-09-17 DIAGNOSIS — Z6841 Body Mass Index (BMI) 40.0 and over, adult: Secondary | ICD-10-CM | POA: Diagnosis not present

## 2020-10-01 DIAGNOSIS — N951 Menopausal and female climacteric states: Secondary | ICD-10-CM | POA: Diagnosis not present

## 2020-10-01 DIAGNOSIS — E282 Polycystic ovarian syndrome: Secondary | ICD-10-CM | POA: Diagnosis not present

## 2020-10-01 DIAGNOSIS — R5382 Chronic fatigue, unspecified: Secondary | ICD-10-CM | POA: Diagnosis not present

## 2020-10-01 DIAGNOSIS — R7301 Impaired fasting glucose: Secondary | ICD-10-CM | POA: Diagnosis not present

## 2020-10-08 DIAGNOSIS — H16143 Punctate keratitis, bilateral: Secondary | ICD-10-CM | POA: Diagnosis not present

## 2020-10-15 DIAGNOSIS — I10 Essential (primary) hypertension: Secondary | ICD-10-CM | POA: Diagnosis not present

## 2020-10-15 DIAGNOSIS — N951 Menopausal and female climacteric states: Secondary | ICD-10-CM | POA: Diagnosis not present

## 2020-10-29 DIAGNOSIS — Z6841 Body Mass Index (BMI) 40.0 and over, adult: Secondary | ICD-10-CM | POA: Diagnosis not present

## 2020-10-29 DIAGNOSIS — R7301 Impaired fasting glucose: Secondary | ICD-10-CM | POA: Diagnosis not present

## 2020-11-12 DIAGNOSIS — Z6841 Body Mass Index (BMI) 40.0 and over, adult: Secondary | ICD-10-CM | POA: Diagnosis not present

## 2020-11-12 DIAGNOSIS — R7303 Prediabetes: Secondary | ICD-10-CM | POA: Diagnosis not present

## 2020-11-12 DIAGNOSIS — E782 Mixed hyperlipidemia: Secondary | ICD-10-CM | POA: Diagnosis not present

## 2020-11-26 DIAGNOSIS — E782 Mixed hyperlipidemia: Secondary | ICD-10-CM | POA: Diagnosis not present

## 2020-11-26 DIAGNOSIS — Z6841 Body Mass Index (BMI) 40.0 and over, adult: Secondary | ICD-10-CM | POA: Diagnosis not present

## 2020-12-10 DIAGNOSIS — Z6841 Body Mass Index (BMI) 40.0 and over, adult: Secondary | ICD-10-CM | POA: Diagnosis not present

## 2020-12-10 DIAGNOSIS — I10 Essential (primary) hypertension: Secondary | ICD-10-CM | POA: Diagnosis not present

## 2020-12-24 DIAGNOSIS — Z6841 Body Mass Index (BMI) 40.0 and over, adult: Secondary | ICD-10-CM | POA: Diagnosis not present

## 2020-12-24 DIAGNOSIS — E782 Mixed hyperlipidemia: Secondary | ICD-10-CM | POA: Diagnosis not present

## 2021-01-07 DIAGNOSIS — R7303 Prediabetes: Secondary | ICD-10-CM | POA: Diagnosis not present

## 2021-01-07 DIAGNOSIS — Z6841 Body Mass Index (BMI) 40.0 and over, adult: Secondary | ICD-10-CM | POA: Diagnosis not present

## 2021-01-21 DIAGNOSIS — Z6841 Body Mass Index (BMI) 40.0 and over, adult: Secondary | ICD-10-CM | POA: Diagnosis not present

## 2021-01-21 DIAGNOSIS — I10 Essential (primary) hypertension: Secondary | ICD-10-CM | POA: Diagnosis not present

## 2021-01-28 DIAGNOSIS — Z6841 Body Mass Index (BMI) 40.0 and over, adult: Secondary | ICD-10-CM | POA: Diagnosis not present

## 2021-01-28 DIAGNOSIS — I10 Essential (primary) hypertension: Secondary | ICD-10-CM | POA: Diagnosis not present

## 2021-02-04 DIAGNOSIS — E782 Mixed hyperlipidemia: Secondary | ICD-10-CM | POA: Diagnosis not present

## 2021-02-04 DIAGNOSIS — Z6841 Body Mass Index (BMI) 40.0 and over, adult: Secondary | ICD-10-CM | POA: Diagnosis not present

## 2021-02-11 DIAGNOSIS — Z6841 Body Mass Index (BMI) 40.0 and over, adult: Secondary | ICD-10-CM | POA: Diagnosis not present

## 2021-02-11 DIAGNOSIS — R7303 Prediabetes: Secondary | ICD-10-CM | POA: Diagnosis not present

## 2021-02-18 DIAGNOSIS — E282 Polycystic ovarian syndrome: Secondary | ICD-10-CM | POA: Diagnosis not present

## 2021-02-18 DIAGNOSIS — Z6841 Body Mass Index (BMI) 40.0 and over, adult: Secondary | ICD-10-CM | POA: Diagnosis not present

## 2021-02-18 DIAGNOSIS — I10 Essential (primary) hypertension: Secondary | ICD-10-CM | POA: Diagnosis not present

## 2021-02-25 DIAGNOSIS — H16223 Keratoconjunctivitis sicca, not specified as Sjogren's, bilateral: Secondary | ICD-10-CM | POA: Diagnosis not present

## 2021-02-25 DIAGNOSIS — H0288B Meibomian gland dysfunction left eye, upper and lower eyelids: Secondary | ICD-10-CM | POA: Diagnosis not present

## 2021-02-25 DIAGNOSIS — Z6841 Body Mass Index (BMI) 40.0 and over, adult: Secondary | ICD-10-CM | POA: Diagnosis not present

## 2021-02-25 DIAGNOSIS — H0288A Meibomian gland dysfunction right eye, upper and lower eyelids: Secondary | ICD-10-CM | POA: Diagnosis not present

## 2021-02-25 DIAGNOSIS — R7303 Prediabetes: Secondary | ICD-10-CM | POA: Diagnosis not present

## 2021-02-26 DIAGNOSIS — E282 Polycystic ovarian syndrome: Secondary | ICD-10-CM | POA: Diagnosis not present

## 2021-03-05 DIAGNOSIS — N926 Irregular menstruation, unspecified: Secondary | ICD-10-CM | POA: Diagnosis not present

## 2021-03-05 DIAGNOSIS — E282 Polycystic ovarian syndrome: Secondary | ICD-10-CM | POA: Diagnosis not present

## 2021-03-05 DIAGNOSIS — R635 Abnormal weight gain: Secondary | ICD-10-CM | POA: Diagnosis not present

## 2021-03-05 DIAGNOSIS — I10 Essential (primary) hypertension: Secondary | ICD-10-CM | POA: Diagnosis not present

## 2021-03-11 DIAGNOSIS — I10 Essential (primary) hypertension: Secondary | ICD-10-CM | POA: Diagnosis not present

## 2021-03-11 DIAGNOSIS — Z01419 Encounter for gynecological examination (general) (routine) without abnormal findings: Secondary | ICD-10-CM | POA: Diagnosis not present

## 2021-03-11 DIAGNOSIS — Z6841 Body Mass Index (BMI) 40.0 and over, adult: Secondary | ICD-10-CM | POA: Diagnosis not present

## 2021-03-25 DIAGNOSIS — E782 Mixed hyperlipidemia: Secondary | ICD-10-CM | POA: Diagnosis not present

## 2021-03-25 DIAGNOSIS — Z6841 Body Mass Index (BMI) 40.0 and over, adult: Secondary | ICD-10-CM | POA: Diagnosis not present

## 2021-03-27 DIAGNOSIS — M549 Dorsalgia, unspecified: Secondary | ICD-10-CM | POA: Diagnosis not present

## 2021-03-27 DIAGNOSIS — M7989 Other specified soft tissue disorders: Secondary | ICD-10-CM | POA: Diagnosis not present

## 2021-03-27 DIAGNOSIS — R768 Other specified abnormal immunological findings in serum: Secondary | ICD-10-CM | POA: Diagnosis not present

## 2021-03-27 DIAGNOSIS — M25561 Pain in right knee: Secondary | ICD-10-CM | POA: Diagnosis not present

## 2021-03-27 DIAGNOSIS — M19011 Primary osteoarthritis, right shoulder: Secondary | ICD-10-CM | POA: Diagnosis not present

## 2021-03-27 DIAGNOSIS — M2550 Pain in unspecified joint: Secondary | ICD-10-CM | POA: Diagnosis not present

## 2021-03-27 DIAGNOSIS — M1711 Unilateral primary osteoarthritis, right knee: Secondary | ICD-10-CM | POA: Diagnosis not present

## 2021-03-27 DIAGNOSIS — M1712 Unilateral primary osteoarthritis, left knee: Secondary | ICD-10-CM | POA: Diagnosis not present

## 2021-04-02 DIAGNOSIS — Z1231 Encounter for screening mammogram for malignant neoplasm of breast: Secondary | ICD-10-CM | POA: Diagnosis not present

## 2021-04-08 DIAGNOSIS — R7303 Prediabetes: Secondary | ICD-10-CM | POA: Diagnosis not present

## 2021-04-08 DIAGNOSIS — Z6841 Body Mass Index (BMI) 40.0 and over, adult: Secondary | ICD-10-CM | POA: Diagnosis not present

## 2021-04-16 DIAGNOSIS — R928 Other abnormal and inconclusive findings on diagnostic imaging of breast: Secondary | ICD-10-CM | POA: Diagnosis not present

## 2021-04-16 DIAGNOSIS — R922 Inconclusive mammogram: Secondary | ICD-10-CM | POA: Diagnosis not present

## 2021-04-16 DIAGNOSIS — R921 Mammographic calcification found on diagnostic imaging of breast: Secondary | ICD-10-CM | POA: Diagnosis not present

## 2021-04-17 ENCOUNTER — Other Ambulatory Visit: Payer: Self-pay | Admitting: Rheumatology

## 2021-04-17 ENCOUNTER — Other Ambulatory Visit: Payer: Self-pay

## 2021-04-17 ENCOUNTER — Ambulatory Visit
Admission: RE | Admit: 2021-04-17 | Discharge: 2021-04-17 | Disposition: A | Discharge status: Home / Self Care | Payer: BC Managed Care – PPO | Source: Ambulatory Visit | Attending: Rheumatology | Admitting: Rheumatology

## 2021-04-17 DIAGNOSIS — M79604 Pain in right leg: Secondary | ICD-10-CM

## 2021-04-17 DIAGNOSIS — M2550 Pain in unspecified joint: Secondary | ICD-10-CM | POA: Diagnosis not present

## 2021-04-17 DIAGNOSIS — M25561 Pain in right knee: Secondary | ICD-10-CM | POA: Diagnosis not present

## 2021-04-17 DIAGNOSIS — M7989 Other specified soft tissue disorders: Secondary | ICD-10-CM

## 2021-04-17 DIAGNOSIS — M1990 Unspecified osteoarthritis, unspecified site: Secondary | ICD-10-CM | POA: Diagnosis not present

## 2021-04-17 DIAGNOSIS — R600 Localized edema: Secondary | ICD-10-CM | POA: Diagnosis not present

## 2021-04-17 DIAGNOSIS — R768 Other specified abnormal immunological findings in serum: Secondary | ICD-10-CM | POA: Diagnosis not present

## 2021-04-17 DIAGNOSIS — M549 Dorsalgia, unspecified: Secondary | ICD-10-CM | POA: Diagnosis not present

## 2021-04-23 DIAGNOSIS — R7303 Prediabetes: Secondary | ICD-10-CM | POA: Diagnosis not present

## 2021-04-23 DIAGNOSIS — Z6841 Body Mass Index (BMI) 40.0 and over, adult: Secondary | ICD-10-CM | POA: Diagnosis not present

## 2021-05-07 DIAGNOSIS — M2241 Chondromalacia patellae, right knee: Secondary | ICD-10-CM | POA: Diagnosis not present

## 2021-05-20 DIAGNOSIS — I10 Essential (primary) hypertension: Secondary | ICD-10-CM | POA: Diagnosis not present

## 2021-05-20 DIAGNOSIS — R7303 Prediabetes: Secondary | ICD-10-CM | POA: Diagnosis not present

## 2021-05-20 DIAGNOSIS — N951 Menopausal and female climacteric states: Secondary | ICD-10-CM | POA: Diagnosis not present

## 2021-05-20 DIAGNOSIS — Z6841 Body Mass Index (BMI) 40.0 and over, adult: Secondary | ICD-10-CM | POA: Diagnosis not present

## 2021-05-28 ENCOUNTER — Other Ambulatory Visit: Payer: Self-pay | Admitting: Rheumatology

## 2021-05-28 DIAGNOSIS — M25561 Pain in right knee: Secondary | ICD-10-CM

## 2021-05-28 DIAGNOSIS — M549 Dorsalgia, unspecified: Secondary | ICD-10-CM | POA: Diagnosis not present

## 2021-05-28 DIAGNOSIS — R768 Other specified abnormal immunological findings in serum: Secondary | ICD-10-CM | POA: Diagnosis not present

## 2021-05-28 DIAGNOSIS — M2550 Pain in unspecified joint: Secondary | ICD-10-CM | POA: Diagnosis not present

## 2021-06-03 DIAGNOSIS — E782 Mixed hyperlipidemia: Secondary | ICD-10-CM | POA: Diagnosis not present

## 2021-06-03 DIAGNOSIS — Z6841 Body Mass Index (BMI) 40.0 and over, adult: Secondary | ICD-10-CM | POA: Diagnosis not present

## 2021-06-08 ENCOUNTER — Ambulatory Visit
Admission: RE | Admit: 2021-06-08 | Discharge: 2021-06-08 | Disposition: A | Discharge status: Home / Self Care | Payer: BC Managed Care – PPO | Source: Ambulatory Visit | Attending: Rheumatology | Admitting: Rheumatology

## 2021-06-08 DIAGNOSIS — M25561 Pain in right knee: Secondary | ICD-10-CM

## 2021-06-14 DIAGNOSIS — M25561 Pain in right knee: Secondary | ICD-10-CM | POA: Diagnosis not present

## 2021-06-17 DIAGNOSIS — I10 Essential (primary) hypertension: Secondary | ICD-10-CM | POA: Diagnosis not present

## 2021-06-17 DIAGNOSIS — Z6841 Body Mass Index (BMI) 40.0 and over, adult: Secondary | ICD-10-CM | POA: Diagnosis not present

## 2021-06-19 DIAGNOSIS — M25561 Pain in right knee: Secondary | ICD-10-CM | POA: Diagnosis not present

## 2021-07-01 DIAGNOSIS — Z6841 Body Mass Index (BMI) 40.0 and over, adult: Secondary | ICD-10-CM | POA: Diagnosis not present

## 2021-07-01 DIAGNOSIS — R7303 Prediabetes: Secondary | ICD-10-CM | POA: Diagnosis not present

## 2021-07-12 DIAGNOSIS — M25561 Pain in right knee: Secondary | ICD-10-CM | POA: Diagnosis not present

## 2021-07-16 DIAGNOSIS — Z6841 Body Mass Index (BMI) 40.0 and over, adult: Secondary | ICD-10-CM | POA: Diagnosis not present

## 2021-07-16 DIAGNOSIS — I10 Essential (primary) hypertension: Secondary | ICD-10-CM | POA: Diagnosis not present

## 2021-07-26 DIAGNOSIS — M25561 Pain in right knee: Secondary | ICD-10-CM | POA: Diagnosis not present

## 2021-07-29 DIAGNOSIS — E782 Mixed hyperlipidemia: Secondary | ICD-10-CM | POA: Diagnosis not present

## 2021-07-29 DIAGNOSIS — Z6841 Body Mass Index (BMI) 40.0 and over, adult: Secondary | ICD-10-CM | POA: Diagnosis not present

## 2021-07-31 DIAGNOSIS — M25561 Pain in right knee: Secondary | ICD-10-CM | POA: Diagnosis not present

## 2021-08-05 DIAGNOSIS — Z6841 Body Mass Index (BMI) 40.0 and over, adult: Secondary | ICD-10-CM | POA: Diagnosis not present

## 2021-08-05 DIAGNOSIS — R7303 Prediabetes: Secondary | ICD-10-CM | POA: Diagnosis not present

## 2021-08-12 DIAGNOSIS — E038 Other specified hypothyroidism: Secondary | ICD-10-CM | POA: Diagnosis not present

## 2021-08-12 DIAGNOSIS — Z6841 Body Mass Index (BMI) 40.0 and over, adult: Secondary | ICD-10-CM | POA: Diagnosis not present

## 2021-08-12 DIAGNOSIS — R7303 Prediabetes: Secondary | ICD-10-CM | POA: Diagnosis not present

## 2021-08-15 DIAGNOSIS — R946 Abnormal results of thyroid function studies: Secondary | ICD-10-CM | POA: Diagnosis not present

## 2021-08-15 DIAGNOSIS — E282 Polycystic ovarian syndrome: Secondary | ICD-10-CM | POA: Diagnosis not present

## 2021-08-15 DIAGNOSIS — R635 Abnormal weight gain: Secondary | ICD-10-CM | POA: Diagnosis not present

## 2021-08-15 DIAGNOSIS — I10 Essential (primary) hypertension: Secondary | ICD-10-CM | POA: Diagnosis not present

## 2021-08-15 DIAGNOSIS — N926 Irregular menstruation, unspecified: Secondary | ICD-10-CM | POA: Diagnosis not present

## 2021-08-21 DIAGNOSIS — K648 Other hemorrhoids: Secondary | ICD-10-CM | POA: Diagnosis not present

## 2021-08-21 DIAGNOSIS — K5730 Diverticulosis of large intestine without perforation or abscess without bleeding: Secondary | ICD-10-CM | POA: Diagnosis not present

## 2021-08-21 DIAGNOSIS — Z1211 Encounter for screening for malignant neoplasm of colon: Secondary | ICD-10-CM | POA: Diagnosis not present

## 2021-08-21 DIAGNOSIS — Z800 Family history of malignant neoplasm of digestive organs: Secondary | ICD-10-CM | POA: Diagnosis not present

## 2021-08-26 DIAGNOSIS — R7303 Prediabetes: Secondary | ICD-10-CM | POA: Diagnosis not present

## 2021-08-26 DIAGNOSIS — E782 Mixed hyperlipidemia: Secondary | ICD-10-CM | POA: Diagnosis not present

## 2021-08-26 DIAGNOSIS — N951 Menopausal and female climacteric states: Secondary | ICD-10-CM | POA: Diagnosis not present

## 2021-08-26 DIAGNOSIS — E038 Other specified hypothyroidism: Secondary | ICD-10-CM | POA: Diagnosis not present

## 2021-09-02 DIAGNOSIS — Z6841 Body Mass Index (BMI) 40.0 and over, adult: Secondary | ICD-10-CM | POA: Diagnosis not present

## 2021-09-02 DIAGNOSIS — R7303 Prediabetes: Secondary | ICD-10-CM | POA: Diagnosis not present

## 2021-09-04 DIAGNOSIS — M549 Dorsalgia, unspecified: Secondary | ICD-10-CM | POA: Diagnosis not present

## 2021-09-04 DIAGNOSIS — M2550 Pain in unspecified joint: Secondary | ICD-10-CM | POA: Diagnosis not present

## 2021-09-04 DIAGNOSIS — R768 Other specified abnormal immunological findings in serum: Secondary | ICD-10-CM | POA: Diagnosis not present

## 2021-09-04 DIAGNOSIS — M25561 Pain in right knee: Secondary | ICD-10-CM | POA: Diagnosis not present

## 2021-09-05 DIAGNOSIS — M25561 Pain in right knee: Secondary | ICD-10-CM | POA: Diagnosis not present

## 2021-09-09 DIAGNOSIS — Z6841 Body Mass Index (BMI) 40.0 and over, adult: Secondary | ICD-10-CM | POA: Diagnosis not present

## 2021-09-09 DIAGNOSIS — E038 Other specified hypothyroidism: Secondary | ICD-10-CM | POA: Diagnosis not present

## 2021-09-17 ENCOUNTER — Other Ambulatory Visit (HOSPITAL_BASED_OUTPATIENT_CLINIC_OR_DEPARTMENT_OTHER): Payer: Self-pay

## 2021-09-17 ENCOUNTER — Ambulatory Visit: Payer: BC Managed Care – PPO | Attending: Internal Medicine

## 2021-09-17 DIAGNOSIS — Z23 Encounter for immunization: Secondary | ICD-10-CM

## 2021-09-17 MED ORDER — PFIZER COVID-19 VAC BIVALENT 30 MCG/0.3ML IM SUSP
INTRAMUSCULAR | 0 refills | Status: AC
  Filled 2021-09-17: qty 0.3, 1d supply, fill #0

## 2021-09-17 NOTE — Progress Notes (Signed)
   Covid-19 Vaccination Clinic  Name:  Lorri Fukuhara    MRN: [id number] DOB: 10-22-1970  09/17/2021  Ms. Wanamaker was observed post Covid-19 immunization for 15 minutes without incident. She was provided with Vaccine Information Sheet and instruction to access the V-Safe system.   Ms. Dado was instructed to call 911 with any severe reactions post vaccine: Difficulty breathing  Swelling of face and throat  A fast heartbeat  A bad rash all over body  Dizziness and weakness   Immunizations Administered     Name Date Dose VIS Date Route   Pfizer Covid-19 Vaccine Bivalent Booster 09/17/2021  5:05 PM 0.3 mL 06/19/2021 Intramuscular   Manufacturer: [employer]   Lot: [id number]   [place]: [phone]

## 2021-09-23 DIAGNOSIS — I10 Essential (primary) hypertension: Secondary | ICD-10-CM | POA: Diagnosis not present

## 2021-09-23 DIAGNOSIS — Z6841 Body Mass Index (BMI) 40.0 and over, adult: Secondary | ICD-10-CM | POA: Diagnosis not present

## 2021-09-27 DIAGNOSIS — S83241A Other tear of medial meniscus, current injury, right knee, initial encounter: Secondary | ICD-10-CM | POA: Diagnosis not present

## 2021-10-07 DIAGNOSIS — Z6841 Body Mass Index (BMI) 40.0 and over, adult: Secondary | ICD-10-CM | POA: Diagnosis not present

## 2021-10-07 DIAGNOSIS — R7303 Prediabetes: Secondary | ICD-10-CM | POA: Diagnosis not present

## 2021-10-28 DIAGNOSIS — I10 Essential (primary) hypertension: Secondary | ICD-10-CM | POA: Diagnosis not present

## 2021-10-28 DIAGNOSIS — Z6841 Body Mass Index (BMI) 40.0 and over, adult: Secondary | ICD-10-CM | POA: Diagnosis not present

## 2021-10-31 DIAGNOSIS — X58XXXA Exposure to other specified factors, initial encounter: Secondary | ICD-10-CM | POA: Diagnosis not present

## 2021-10-31 DIAGNOSIS — G8918 Other acute postprocedural pain: Secondary | ICD-10-CM | POA: Diagnosis not present

## 2021-10-31 DIAGNOSIS — M94261 Chondromalacia, right knee: Secondary | ICD-10-CM | POA: Diagnosis not present

## 2021-10-31 DIAGNOSIS — M6751 Plica syndrome, right knee: Secondary | ICD-10-CM | POA: Diagnosis not present

## 2021-10-31 DIAGNOSIS — S83271A Complex tear of lateral meniscus, current injury, right knee, initial encounter: Secondary | ICD-10-CM | POA: Diagnosis not present

## 2021-10-31 DIAGNOSIS — Y999 Unspecified external cause status: Secondary | ICD-10-CM | POA: Diagnosis not present

## 2021-10-31 DIAGNOSIS — S83241A Other tear of medial meniscus, current injury, right knee, initial encounter: Secondary | ICD-10-CM | POA: Diagnosis not present

## 2021-11-07 DIAGNOSIS — M25561 Pain in right knee: Secondary | ICD-10-CM | POA: Diagnosis not present

## 2021-11-12 DIAGNOSIS — M25561 Pain in right knee: Secondary | ICD-10-CM | POA: Diagnosis not present

## 2021-11-19 DIAGNOSIS — M25561 Pain in right knee: Secondary | ICD-10-CM | POA: Diagnosis not present

## 2021-12-02 DIAGNOSIS — Z6841 Body Mass Index (BMI) 40.0 and over, adult: Secondary | ICD-10-CM | POA: Diagnosis not present

## 2021-12-02 DIAGNOSIS — E782 Mixed hyperlipidemia: Secondary | ICD-10-CM | POA: Diagnosis not present

## 2021-12-02 DIAGNOSIS — M25561 Pain in right knee: Secondary | ICD-10-CM | POA: Diagnosis not present

## 2021-12-10 DIAGNOSIS — M25561 Pain in right knee: Secondary | ICD-10-CM | POA: Diagnosis not present

## 2021-12-16 DIAGNOSIS — Z6841 Body Mass Index (BMI) 40.0 and over, adult: Secondary | ICD-10-CM | POA: Diagnosis not present

## 2021-12-16 DIAGNOSIS — I10 Essential (primary) hypertension: Secondary | ICD-10-CM | POA: Diagnosis not present

## 2021-12-17 DIAGNOSIS — M25561 Pain in right knee: Secondary | ICD-10-CM | POA: Diagnosis not present

## 2021-12-24 DIAGNOSIS — M25561 Pain in right knee: Secondary | ICD-10-CM | POA: Diagnosis not present

## 2022-01-01 DIAGNOSIS — M25561 Pain in right knee: Secondary | ICD-10-CM | POA: Diagnosis not present

## 2022-01-08 DIAGNOSIS — M25561 Pain in right knee: Secondary | ICD-10-CM | POA: Diagnosis not present

## 2022-01-24 DIAGNOSIS — Z0289 Encounter for other administrative examinations: Secondary | ICD-10-CM

## 2022-01-27 DIAGNOSIS — I10 Essential (primary) hypertension: Secondary | ICD-10-CM | POA: Diagnosis not present

## 2022-01-27 DIAGNOSIS — Z6841 Body Mass Index (BMI) 40.0 and over, adult: Secondary | ICD-10-CM | POA: Diagnosis not present

## 2022-02-04 ENCOUNTER — Ambulatory Visit (INDEPENDENT_AMBULATORY_CARE_PROVIDER_SITE_OTHER): Payer: BC Managed Care – PPO | Admitting: Bariatrics

## 2022-02-04 ENCOUNTER — Encounter (INDEPENDENT_AMBULATORY_CARE_PROVIDER_SITE_OTHER): Payer: Self-pay | Admitting: Bariatrics

## 2022-02-04 VITALS — BP 126/83 | HR 66 | Temp 98.4°F | Ht 65.0 in | Wt 274.0 lb

## 2022-02-04 DIAGNOSIS — E668 Other obesity: Secondary | ICD-10-CM

## 2022-02-04 DIAGNOSIS — R5383 Other fatigue: Secondary | ICD-10-CM | POA: Diagnosis not present

## 2022-02-04 DIAGNOSIS — I10 Essential (primary) hypertension: Secondary | ICD-10-CM

## 2022-02-04 DIAGNOSIS — E282 Polycystic ovarian syndrome: Secondary | ICD-10-CM

## 2022-02-04 DIAGNOSIS — Z1331 Encounter for screening for depression: Secondary | ICD-10-CM | POA: Diagnosis not present

## 2022-02-04 DIAGNOSIS — E559 Vitamin D deficiency, unspecified: Secondary | ICD-10-CM

## 2022-02-04 DIAGNOSIS — R0602 Shortness of breath: Secondary | ICD-10-CM

## 2022-02-04 DIAGNOSIS — Z6841 Body Mass Index (BMI) 40.0 and over, adult: Secondary | ICD-10-CM

## 2022-02-04 DIAGNOSIS — R7303 Prediabetes: Secondary | ICD-10-CM

## 2022-02-18 ENCOUNTER — Encounter (INDEPENDENT_AMBULATORY_CARE_PROVIDER_SITE_OTHER): Payer: Self-pay | Admitting: Bariatrics

## 2022-02-18 ENCOUNTER — Ambulatory Visit (INDEPENDENT_AMBULATORY_CARE_PROVIDER_SITE_OTHER): Payer: BC Managed Care – PPO | Admitting: Bariatrics

## 2022-02-18 VITALS — BP 135/81 | HR 69 | Ht 65.0 in | Wt 277.0 lb

## 2022-02-18 DIAGNOSIS — E669 Obesity, unspecified: Secondary | ICD-10-CM

## 2022-02-18 DIAGNOSIS — E559 Vitamin D deficiency, unspecified: Secondary | ICD-10-CM | POA: Diagnosis not present

## 2022-02-18 DIAGNOSIS — I10 Essential (primary) hypertension: Secondary | ICD-10-CM | POA: Diagnosis not present

## 2022-02-18 DIAGNOSIS — R5383 Other fatigue: Secondary | ICD-10-CM | POA: Diagnosis not present

## 2022-02-18 DIAGNOSIS — F5089 Other specified eating disorder: Secondary | ICD-10-CM | POA: Diagnosis not present

## 2022-02-18 DIAGNOSIS — R7303 Prediabetes: Secondary | ICD-10-CM

## 2022-02-18 DIAGNOSIS — Z6841 Body Mass Index (BMI) 40.0 and over, adult: Secondary | ICD-10-CM

## 2022-02-18 DIAGNOSIS — E282 Polycystic ovarian syndrome: Secondary | ICD-10-CM | POA: Diagnosis not present

## 2022-02-18 NOTE — Progress Notes (Signed)
? ? ? ?Chief Complaint:  ? ?OBESITY ?Brittany Mills (MR# [id number]) is a 51 y.o. female who presents for evaluation and treatment of obesity and related comorbidities. Current BMI is Body mass index is 45.6 kg/m?Brittany Mills has been struggling with her weight for many years and has been unsuccessful in either losing weight, maintaining weight loss, or reaching her healthy weight goal. ? ?Brittany Mills states that she does not like to cook. She craves sweets with her cycle and lack of protein. ? ?Brittany Mills is currently in the action stage of change and ready to dedicate time achieving and maintaining a healthier weight. Brittany Mills is interested in becoming our patient and working on intensive lifestyle modifications including (but not limited to) diet and exercise for weight loss. ? ?Brittany Mills's habits were reviewed today and are as follows: she thinks her family will eat healthier with her, her desired weight loss is 94 pounds, she has been heavy most of her life, she started gaining weight after I had my daughter, her heaviest weight ever was 322 pounds, she has significant food cravings issues, she snacks frequently in the evenings, she wakes up frequently in the middle of the night to eat, she is frequently drinking liquids with calories, she frequently makes poor food choices, she has problems with excessive hunger, she frequently eats larger portions than normal, and she struggles with emotional eating. ? ?Depression Screen ?Brittany Mills's Food and Mood (modified PHQ-9) score was 9. ? ? ?  02/04/2022  ?  7:48 AM  ?Depression screen PHQ 2/9  ?Decreased Interest 2  ?Down, Depressed, Hopeless 1  ?PHQ - 2 Score 3  ?Altered sleeping 2  ?Tired, decreased energy 2  ?Change in appetite 1  ?Feeling bad or failure about yourself  1  ?Trouble concentrating 0  ?Moving slowly or fidgety/restless 0  ?Suicidal thoughts 0  ?PHQ-9 Score 9  ?Difficult doing work/chores Not difficult at all  ? ?Subjective:  ? ?1. Other fatigue ?[name] admits to daytime  somnolence and admits to waking up still tired. Patient has a history of symptoms of daytime fatigue and morning fatigue. Brittany Mills generally gets 6 hours of sleep per night, and states that she has difficulty falling asleep. Snoring is present. Apneic episodes is present. Epworth Sleepiness Score is 5.  Brittany Mills will continue activities.  ? ?2. SOB (shortness of breath) on exertion ?Brittany Mills notes increasing shortness of breath with exercising and seems to be worsening over time with weight gain. She notes getting out of breath sooner with activity than she used to. This has not gotten worse recently. Brittany Mills denies shortness of breath at rest or orthopnea. Brittany Mills will gradually increase activities.  ? ?3. PCOS (polycystic ovarian syndrome) ?Brittany Mills sees an endocrinologist. She is no currently on medications. She is allergic to Metformin.  ? ?4. Essential hypertension ?Brittany Mills is currently taking Losartan and HCTZ. Her blood pressure is controlled. Her blood pressure was 126/83. ? ?5. Prediabetes ?Brittany Mills is not on medications. She is allergic to Metformin.  ? ?6. Vitamin D deficiency ?Brittany Mills is taking multi-vitamins, Vitamin D, and folic acid.  ? ?Assessment/Plan:  ? ?1. Other fatigue ?[name] does feel that her weight is causing her energy to be lower than it should be. Fatigue may be related to obesity, depression or many other causes. Labs will be ordered, and in the meanwhile, Brittany Mills will focus on self care including making healthy food choices, increasing physical activity and focusing on stress reduction.  ? ?- EKG 12-Lead ?- TSH+T4F+T3Free ? ?2. SOB (shortness of  breath) on exertion ?Sontee does feel that she gets out of breath more easily that she used to when she exercises. Brittany Mills's shortness of breath appears to be obesity related and exercise induced. She has agreed to work on weight loss and gradually increase exercise to treat her exercise induced shortness of breath. Will continue to monitor closely.  ?-  TSH+T4F+T3Free ? ?3. PCOS (polycystic ovarian syndrome) ?Intensive lifestyle modifications are first line treatment for this issue. Brittany Mills will continue with endocrinologist. She will minimize all carbohydrates (sweets and starches). We will check labs today. We discussed several lifestyle modifications today and she will continue to work on diet, exercise and weight loss efforts. Orders and follow up as documented in patient record. ? ?Counseling ?PCOS is a leading cause of menstrual irregularities and infertility. It is also associated with obesity, hirsutism (excessive hair growth on the face, chest, or back), and cardiovascular risk factors such as high cholesterol and insulin resistance. ?Insulin resistance appears to play a central role.  ?Women with PCOS have been shown to have impaired appetite-regulating hormones. ?Metformin is one medication that can improve metabolic parameters.  ?Women with polycystic ovary syndrome (PCOS) have an increased risk for cardiovascular disease (CVD) - European Journal of Preventive Cardiology.  ? ?- Hemoglobin A1c ?- Insulin, random ? ?4. Essential hypertension ?Brittany Mills will continue her medications. We will check labs today. She is working on healthy weight loss and exercise to improve blood pressure control. We will watch for signs of hypotension as she continues her lifestyle modifications. ? ?- Hemoglobin A1c ?- Insulin, random ?- Lipid Panel With LDL/HDL Ratio ?- VITAMIN D 25 Hydroxy (Vit-D Deficiency, Fractures) ?- Comprehensive metabolic panel ?- [id number] ? ?5. Prediabetes ?Brittany Mills will continue to work on weight loss, exercise, and decreasing simple carbohydrates to help decrease the risk of diabetes. Brittany Mills will decrease carbohydrates. She will increase activities. We will check labs today.  ? ?- Hemoglobin A1c ?- Insulin, random ?- Lipid Panel With LDL/HDL Ratio ?- Comprehensive metabolic panel ? ?6. Vitamin D deficiency ?Low Vitamin D level contributes to  fatigue and are associated with obesity, breast, and colon cancer. Brittany Mills will continue prescription Vitamin D 50,000 IU every week and she will follow-up for routine testing of Vitamin D, at least 2-3 times per year to avoid over-replacement. We will check Vitamin D today.  ? ?- VITAMIN D 25 Hydroxy (Vit-D Deficiency, Fractures) ? ?7. Depression screen ?Coralee had a positive depression screening. Depression is commonly associated with obesity and often results in emotional eating behaviors. We will monitor this closely and work on CBT to help improve the non-hunger eating patterns. Referral to Psychology may be required if no improvement is seen as she continues in our clinic. ? ?8. Class 3 severe obesity with serious comorbidity and body mass index (BMI) of 45.0 to 49.9 in adult, unspecified obesity type ([place]) ?Cyerra is currently in the action stage of change and her goal is to continue with weight loss efforts. I recommend Kalynne begin the structured treatment plan as follows: ? ?She has agreed to the Category 3 Plan and keeping a food journal and adhering to recommended goals of 1500 calories and 80-90 grams of protein. ? ?Nanetta will continue meal planning and she will be mindful eating.  ? ?Exercise goals:  Malissia will start walking.    ? ?Behavioral modification strategies: increasing lean protein intake, decreasing simple carbohydrates, increasing vegetables, increasing water intake, decreasing eating out, no skipping meals, meal planning and cooking strategies, keeping healthy foods  in the home, and planning for success. ? ?She was informed of the importance of frequent follow-up visits to maximize her success with intensive lifestyle modifications for her multiple health conditions. She was informed we would discuss her lab results at her next visit unless there is a critical issue that needs to be addressed sooner. Purva agreed to keep her next visit at the agreed upon time to discuss these  results. ? ?Objective:  ? ?Blood pressure 126/83, pulse 66, temperature 98.4 ?F (36.9 ?C), height '5\' 5"'$  (1.651 m), weight 274 lb (124.3 kg), last menstrual period 01/16/2022, SpO2 100 %. Body mass index is 45.6 kg/m?. ? ?EKG: N

## 2022-02-19 DIAGNOSIS — F5101 Primary insomnia: Secondary | ICD-10-CM | POA: Insufficient documentation

## 2022-02-19 DIAGNOSIS — I10 Essential (primary) hypertension: Secondary | ICD-10-CM | POA: Insufficient documentation

## 2022-02-19 DIAGNOSIS — E282 Polycystic ovarian syndrome: Secondary | ICD-10-CM | POA: Insufficient documentation

## 2022-02-19 DIAGNOSIS — E038 Other specified hypothyroidism: Secondary | ICD-10-CM | POA: Insufficient documentation

## 2022-02-19 DIAGNOSIS — E782 Mixed hyperlipidemia: Secondary | ICD-10-CM | POA: Insufficient documentation

## 2022-02-19 DIAGNOSIS — R232 Flushing: Secondary | ICD-10-CM | POA: Insufficient documentation

## 2022-02-19 DIAGNOSIS — Z780 Asymptomatic menopausal state: Secondary | ICD-10-CM | POA: Insufficient documentation

## 2022-02-19 DIAGNOSIS — R7303 Prediabetes: Secondary | ICD-10-CM | POA: Insufficient documentation

## 2022-02-19 DIAGNOSIS — Z6841 Body Mass Index (BMI) 40.0 and over, adult: Secondary | ICD-10-CM | POA: Insufficient documentation

## 2022-02-19 LAB — LIPID PANEL WITH LDL/HDL RATIO
Cholesterol, Total: 192 mg/dL (ref 100–199)
HDL: 73 mg/dL (ref >39)
LDL Chol Calc (NIH): 104 mg/dL — ABNORMAL HIGH (ref 0–99)
LDL/HDL Ratio: 1.4 ratio (ref 0.0–3.2)
Triglycerides: 82 mg/dL (ref 0–149)
VLDL Cholesterol Cal: 15 mg/dL (ref 5–40)

## 2022-02-19 LAB — COMPREHENSIVE METABOLIC PANEL
ALT: 18 IU/L (ref 0–32)
AST: 15 IU/L (ref 0–40)
Albumin/Globulin Ratio: 1.9 (ref 1.2–2.2)
Albumin: 4.3 g/dL (ref 3.8–4.9)
Alkaline Phosphatase: 47 IU/L (ref 44–121)
BUN/Creatinine Ratio: 30 — ABNORMAL HIGH (ref 9–23)
BUN: 19 mg/dL (ref 6–24)
Bilirubin Total: 0.3 mg/dL (ref 0.0–1.2)
CO2: 25 mmol/L (ref 20–29)
Calcium: 9 mg/dL (ref 8.7–10.2)
Chloride: 105 mmol/L (ref 96–106)
Creatinine, Ser: 0.64 mg/dL (ref 0.57–1.00)
Globulin, Total: 2.3 g/dL (ref 1.5–4.5)
Glucose: 92 mg/dL (ref 70–99)
Potassium: 4.4 mmol/L (ref 3.5–5.2)
Sodium: 141 mmol/L (ref 134–144)
Total Protein: 6.6 g/dL (ref 6.0–8.5)
eGFR: 107 mL/min/{1.73_m2} (ref >59)

## 2022-02-19 LAB — TSH+T4F+T3FREE
Free T4: 1 ng/dL (ref 0.82–1.77)
T3, Free: 2.9 pg/mL (ref 2.0–4.4)
TSH: 0.939 u[IU]/mL (ref 0.450–4.500)

## 2022-02-19 LAB — HEMOGLOBIN A1C
Est. average glucose Bld gHb Est-mCnc: 100 mg/dL
Hgb A1c MFr Bld: 5.1 % (ref 4.8–5.6)

## 2022-02-19 LAB — VITAMIN D 25 HYDROXY (VIT D DEFICIENCY, FRACTURES): Vit D, 25-Hydroxy: 36.2 ng/mL (ref 30.0–100.0)

## 2022-02-19 LAB — INSULIN, RANDOM: INSULIN: 8.6 u[IU]/mL (ref 2.6–24.9)

## 2022-02-26 DIAGNOSIS — I10 Essential (primary) hypertension: Secondary | ICD-10-CM | POA: Diagnosis not present

## 2022-02-26 DIAGNOSIS — R7301 Impaired fasting glucose: Secondary | ICD-10-CM | POA: Diagnosis not present

## 2022-02-26 NOTE — Progress Notes (Signed)
Chief Complaint:   OBESITY Brittany Mills is here to discuss her progress with her obesity treatment plan along with follow-up of her obesity related diagnoses. Brittany Mills is on the Category 3 Plan and keeping a food journal and adhering to recommended goals of 1500 calories and 80-90 grams of protein and states she is following her eating plan approximately 50% of the time. Brittany Mills states she is walking for 45-60 minutes 7 times per week.  Today's visit was #: 2 Starting weight: 274 lbs Starting date: 02/04/2022 Today's weight: 277 lbs Today's date: 02/18/2022 Total lbs lost to date: 0 Total lbs lost since last in-office visit: 0  Interim History: Brittany Mills is up 3 lbs since her first visit. She states that things were good the first week. He did have some cravings with her cycle.   Subjective:   1. Essential hypertension Brittany Mills is currently taking Hyzaar. Her blood pressure is reasonably well controlled. Her blood pressure today was 135/81.  2. Prediabetes Brittany Mills is not on medications currently.   3. Other disorder of eating .Brittany Mills notes disorder of eating.   Assessment/Plan:   1. Essential hypertension Brittany Mills will continue her medications. She is working on healthy weight loss and exercise to improve blood pressure control. We will watch for signs of hypotension as she continues her lifestyle modifications.  2. Prediabetes Brittany Mills will keep all carbohydrates low (starches and sweets). She will continue to work on weight loss, exercise, and decreasing simple carbohydrates to help decrease the risk of diabetes.   3. Other disorder of eating Behavior modification techniques were discussed today to help Brittany Mills deal with her emotional/non-hunger eating behaviors.  We discussed continuing medications. She will continue Qsymia and individual components. Orders and follow up as documented in patient record.   4. Obesity, Current BMI 46.2 Brittany Mills is currently in the action stage of change. As  such, her goal is to continue with weight loss efforts. She has agreed to the Category 3 Plan.   Brittany Mills will continue meal planning and she will continue intentional eating. She had labs drawn today and we will review labs at her next visit.  Exercise goals:  As is.   Behavioral modification strategies: increasing lean protein intake, decreasing simple carbohydrates, increasing vegetables, increasing water intake, decreasing eating out, no skipping meals, meal planning and cooking strategies, keeping healthy foods in the home, and planning for success.  Brittany Mills has agreed to follow-up with our clinic in 3 weeks with nurse practitioner and 6 weeks with myself. She was informed of the importance of frequent follow-up visits to maximize her success with intensive lifestyle modifications for her multiple health conditions.   Objective:   Blood pressure 135/81, pulse 69, height '5\' 5"'$  (1.651 m), weight 277 lb (125.6 kg), SpO2 93 %. Body mass index is 46.1 kg/m.  General: Cooperative, alert, well developed, in no acute distress. HEENT: Conjunctivae and lids unremarkable. Cardiovascular: Regular rhythm.  Lungs: Normal work of breathing. Neurologic: No focal deficits.   Lab Results  Component Value Date   CREATININE 0.64 02/18/2022   BUN 19 02/18/2022   NA 141 02/18/2022   K 4.4 02/18/2022   CL 105 02/18/2022   CO2 25 02/18/2022   Lab Results  Component Value Date   ALT 18 02/18/2022   AST 15 02/18/2022   ALKPHOS 47 02/18/2022   BILITOT 0.3 02/18/2022   Lab Results  Component Value Date   HGBA1C 5.1 02/18/2022   Lab Results  Component Value Date   INSULIN  8.6 02/18/2022   Lab Results  Component Value Date   TSH 0.939 02/18/2022   Lab Results  Component Value Date   CHOL 192 02/18/2022   HDL 73 02/18/2022   LDLCALC 104 (H) 02/18/2022   TRIG 82 02/18/2022   Lab Results  Component Value Date   VD25OH 36.2 02/18/2022   Lab Results  Component Value Date   WBC 6.7  04/01/2017   HGB 12.9 04/01/2017   HCT 40.1 04/01/2017   MCV 82.0 04/01/2017   PLT 210 04/01/2017   No results found for: IRON, TIBC, FERRITIN  Attestation Statements:   Reviewed by clinician on day of visit: allergies, medications, problem list, medical history, surgical history, family history, social history, and previous encounter notes.  Time spent on visit including pre-visit chart review and post-visit care and charting was 30 minutes.   I, Lizbeth Bark, RMA, am acting as Location manager for [employer], DO.  I have reviewed the above documentation for accuracy and completeness, and I agree with the above. Jearld Lesch, DO

## 2022-03-05 DIAGNOSIS — R7301 Impaired fasting glucose: Secondary | ICD-10-CM | POA: Diagnosis not present

## 2022-03-05 DIAGNOSIS — E282 Polycystic ovarian syndrome: Secondary | ICD-10-CM | POA: Diagnosis not present

## 2022-03-05 DIAGNOSIS — I10 Essential (primary) hypertension: Secondary | ICD-10-CM | POA: Diagnosis not present

## 2022-03-06 DIAGNOSIS — M1711 Unilateral primary osteoarthritis, right knee: Secondary | ICD-10-CM | POA: Diagnosis not present

## 2022-03-10 DIAGNOSIS — M25561 Pain in right knee: Secondary | ICD-10-CM | POA: Diagnosis not present

## 2022-03-10 DIAGNOSIS — R768 Other specified abnormal immunological findings in serum: Secondary | ICD-10-CM | POA: Diagnosis not present

## 2022-03-10 DIAGNOSIS — M2550 Pain in unspecified joint: Secondary | ICD-10-CM | POA: Diagnosis not present

## 2022-03-10 DIAGNOSIS — M549 Dorsalgia, unspecified: Secondary | ICD-10-CM | POA: Diagnosis not present

## 2022-03-12 ENCOUNTER — Encounter (INDEPENDENT_AMBULATORY_CARE_PROVIDER_SITE_OTHER): Payer: Self-pay | Admitting: Bariatrics

## 2022-03-13 DIAGNOSIS — N644 Mastodynia: Secondary | ICD-10-CM | POA: Diagnosis not present

## 2022-03-13 DIAGNOSIS — Z01419 Encounter for gynecological examination (general) (routine) without abnormal findings: Secondary | ICD-10-CM | POA: Diagnosis not present

## 2022-03-13 DIAGNOSIS — E282 Polycystic ovarian syndrome: Secondary | ICD-10-CM | POA: Diagnosis not present

## 2022-03-13 DIAGNOSIS — M1711 Unilateral primary osteoarthritis, right knee: Secondary | ICD-10-CM | POA: Diagnosis not present

## 2022-03-13 DIAGNOSIS — I10 Essential (primary) hypertension: Secondary | ICD-10-CM | POA: Diagnosis not present

## 2022-03-19 ENCOUNTER — Encounter (INDEPENDENT_AMBULATORY_CARE_PROVIDER_SITE_OTHER): Payer: Self-pay | Admitting: Adult Health

## 2022-03-19 ENCOUNTER — Ambulatory Visit (INDEPENDENT_AMBULATORY_CARE_PROVIDER_SITE_OTHER): Payer: BC Managed Care – PPO | Admitting: Adult Health

## 2022-03-19 VITALS — BP 119/76 | HR 71 | Temp 97.3°F | Ht 65.0 in | Wt 275.0 lb

## 2022-03-19 DIAGNOSIS — E669 Obesity, unspecified: Secondary | ICD-10-CM

## 2022-03-19 DIAGNOSIS — R7303 Prediabetes: Secondary | ICD-10-CM

## 2022-03-19 DIAGNOSIS — Z6841 Body Mass Index (BMI) 40.0 and over, adult: Secondary | ICD-10-CM

## 2022-03-20 DIAGNOSIS — F4322 Adjustment disorder with anxiety: Secondary | ICD-10-CM | POA: Diagnosis not present

## 2022-03-20 DIAGNOSIS — N644 Mastodynia: Secondary | ICD-10-CM | POA: Diagnosis not present

## 2022-03-21 DIAGNOSIS — M1711 Unilateral primary osteoarthritis, right knee: Secondary | ICD-10-CM | POA: Diagnosis not present

## 2022-03-25 NOTE — Progress Notes (Signed)
Chief Complaint:   OBESITY Brittany Mills is here to discuss her progress with her obesity treatment plan along with follow-up of her obesity related diagnoses. Brittany Mills is on the Category 3 Plan and keeping a food journal and adhering to recommended goals of 1500 calories and 80-90 gms protein and states she is following her eating plan approximately 50% of the time. Brittany Mills states she has not been exercising because she has been on vacation.  Today's visit was #: 3 Starting weight: 274 lbs Starting date: 02/04/2022 Today's weight: 275 lbs Today's date: 03/19/22 Total lbs lost to date: 0 Total lbs lost since last in-office visit: -2  Interim History:  She is following a hybrid of category 3 and tracking intake. Dr. Kipp Brood started her on Wegovy-12-week duration at 0.25 mg strength.  Subjective:   1. Prediabetes She is metformin intolerant-causes a rash. 02/18/2022 A1c 5.1-normal, with only slightly elevated insulin level of 8.6.   She is not currently on any blood glucose lowering medications. Dr. Kipp Brood started her on Wegovy-12-week duration at 0.25 mg strength.  Assessment/Plan:   1. Prediabetes Continue to increase protein, limit carbohydrates.  2. Obesity, Current BMI 45.9 Brittany Mills is currently in the action stage of change. As such, her goal is to continue with weight loss efforts. She has agreed to the Category 3 Plan and keeping a food journal and adhering to recommended goals of 1500 calories and 90 gms protein.   Dr. Kipp Brood started her on Wegovy-12-week duration at 0.25 mg strength.  Handout: High-protein/low calorie  Exercise goals: No exercise has been prescribed at this time.  Behavioral modification strategies: increasing lean protein intake, decreasing simple carbohydrates, increasing water intake, meal planning and cooking strategies, keeping healthy foods in the home, planning for success, and keeping a strict food journal.  Brittany Mills has agreed to follow-up  with our clinic in 2-3 weeks.  She was informed of the importance of frequent follow-up visits to maximize her success with intensive lifestyle modifications for her multiple health conditions.   Objective:   Blood pressure 119/76, pulse 71, temperature (!) 97.3 F (36.3 C), height '5\' 5"'$  (1.651 m), weight 275 lb (124.7 kg), SpO2 100 %. Body mass index is 45.76 kg/m.  General: Cooperative, alert, well developed, in no acute distress. HEENT: Conjunctivae and lids unremarkable. Cardiovascular: Regular rhythm.  Lungs: Normal work of breathing. Neurologic: No focal deficits.   Lab Results  Component Value Date   CREATININE 0.64 02/18/2022   BUN 19 02/18/2022   NA 141 02/18/2022   K 4.4 02/18/2022   CL 105 02/18/2022   CO2 25 02/18/2022   Lab Results  Component Value Date   ALT 18 02/18/2022   AST 15 02/18/2022   ALKPHOS 47 02/18/2022   BILITOT 0.3 02/18/2022   Lab Results  Component Value Date   HGBA1C 5.1 02/18/2022   Lab Results  Component Value Date   INSULIN 8.6 02/18/2022   Lab Results  Component Value Date   TSH 0.939 02/18/2022   Lab Results  Component Value Date   CHOL 192 02/18/2022   HDL 73 02/18/2022   LDLCALC 104 (H) 02/18/2022   TRIG 82 02/18/2022   Lab Results  Component Value Date   VD25OH 36.2 02/18/2022   Lab Results  Component Value Date   WBC 6.7 04/01/2017   HGB 12.9 04/01/2017   HCT 40.1 04/01/2017   MCV 82.0 04/01/2017   PLT 210 04/01/2017   No results found for: IRON, TIBC, FERRITIN  Attestation Statements:   Reviewed by clinician on day of visit: allergies, medications, problem list, medical history, surgical history, family history, social history, and previous encounter notes.  Time spent on visit including pre-visit chart review and post-visit care and charting was 28 minutes.   I, Georgianne Fick, FNP, am acting as Location manager for Mina Marble, NP.  I have reviewed the above documentation for accuracy and  completeness, and I agree with the above. -  Amayrani Bennick d. Carrol Bondar, NP-C

## 2022-03-26 DIAGNOSIS — Z01419 Encounter for gynecological examination (general) (routine) without abnormal findings: Secondary | ICD-10-CM | POA: Diagnosis not present

## 2022-03-26 DIAGNOSIS — Z124 Encounter for screening for malignant neoplasm of cervix: Secondary | ICD-10-CM | POA: Diagnosis not present

## 2022-04-09 ENCOUNTER — Encounter (INDEPENDENT_AMBULATORY_CARE_PROVIDER_SITE_OTHER): Payer: Self-pay | Admitting: Bariatrics

## 2022-04-09 ENCOUNTER — Ambulatory Visit (INDEPENDENT_AMBULATORY_CARE_PROVIDER_SITE_OTHER): Payer: BC Managed Care – PPO | Admitting: Bariatrics

## 2022-04-09 VITALS — BP 118/77 | HR 73 | Temp 98.0°F | Ht 65.0 in | Wt 278.0 lb

## 2022-04-09 DIAGNOSIS — E782 Mixed hyperlipidemia: Secondary | ICD-10-CM | POA: Diagnosis not present

## 2022-04-09 DIAGNOSIS — E669 Obesity, unspecified: Secondary | ICD-10-CM

## 2022-04-09 DIAGNOSIS — R7303 Prediabetes: Secondary | ICD-10-CM | POA: Diagnosis not present

## 2022-04-09 DIAGNOSIS — Z6841 Body Mass Index (BMI) 40.0 and over, adult: Secondary | ICD-10-CM | POA: Diagnosis not present

## 2022-04-14 DIAGNOSIS — F4322 Adjustment disorder with anxiety: Secondary | ICD-10-CM | POA: Diagnosis not present

## 2022-04-15 ENCOUNTER — Encounter (INDEPENDENT_AMBULATORY_CARE_PROVIDER_SITE_OTHER): Payer: Self-pay | Admitting: Bariatrics

## 2022-04-28 ENCOUNTER — Encounter (INDEPENDENT_AMBULATORY_CARE_PROVIDER_SITE_OTHER): Payer: Self-pay | Admitting: Adult Health

## 2022-04-28 ENCOUNTER — Ambulatory Visit (INDEPENDENT_AMBULATORY_CARE_PROVIDER_SITE_OTHER): Payer: BC Managed Care – PPO | Admitting: Adult Health

## 2022-04-28 VITALS — BP 122/80 | HR 67 | Temp 98.1°F | Ht 65.0 in | Wt 284.0 lb

## 2022-04-28 DIAGNOSIS — R7303 Prediabetes: Secondary | ICD-10-CM

## 2022-04-28 DIAGNOSIS — E669 Obesity, unspecified: Secondary | ICD-10-CM

## 2022-04-28 DIAGNOSIS — Z6841 Body Mass Index (BMI) 40.0 and over, adult: Secondary | ICD-10-CM | POA: Diagnosis not present

## 2022-04-28 NOTE — Progress Notes (Signed)
Chief Complaint:   OBESITY Brittany Mills is here to discuss her progress with her obesity treatment plan along with follow-up of her obesity related diagnoses. Brittany Mills is on the Category 3 Plan or keeping a food journal and adhering to recommended goals of 1500 calories and 90 grams of protein daily and states she is following her eating plan approximately 20% of the time. Brittany Mills states she is walking for 30-45 minutes 5 times per week.  Today's visit was #: 5 Starting weight: 247 lbs Starting date: 02/04/2022 Today's weight: 284 lbs Today's date: 04/28/2022 Total lbs lost to date: 0 Total lbs lost since last in-office visit: 0  Interim History: Brittany Mills endorses increased stress level, her 52 year old daughter lost her employment Haematologist); And she is dealing with 54-monthseparation with her husband.  Subjective:   1. Pre-diabetes ***[facility]arniceis [facility]  Assessment/Plan:   1. Pre-diabetes Brittany Mills to call the pharmacy weekly [facility]  2. Obesity, Current BMI 47.3 Brittany Mills is currently in the action stage of change. As such, her goal is to continue with weight loss efforts. She has agreed to the Category 3 Plan with intermittent fasting 14/10 option.   Exercise goals: As is.  Behavioral modification strategies: increasing lean protein intake, decreasing simple carbohydrates, meal planning and cooking strategies, keeping healthy foods in the home, and planning for success.  Brittany Mills agreed to follow-up with our clinic in 4 weeks. She was informed of the importance of frequent follow-up visits to maximize her success with intensive lifestyle modifications for her multiple health conditions.   Objective:   Blood pressure 122/80, pulse 67, temperature 98.1 F (36.7 C), height '5\' 5"'$  (1.651 m), weight 284 lb (128.8 kg), SpO2 100 %. Body mass index is 47.26 kg/m.  General: Cooperative, alert, well developed, in no acute  distress. HEENT: Conjunctivae and lids unremarkable. Cardiovascular: Regular rhythm.  Lungs: Normal work of breathing. Neurologic: No focal deficits.   Lab Results  Component Value Date   CREATININE 0.64 02/18/2022   BUN 19 02/18/2022   NA 141 02/18/2022   K 4.4 02/18/2022   CL 105 02/18/2022   CO2 25 02/18/2022   Lab Results  Component Value Date   ALT 18 02/18/2022   AST 15 02/18/2022   ALKPHOS 47 02/18/2022   BILITOT 0.3 02/18/2022   Lab Results  Component Value Date   HGBA1C 5.1 02/18/2022   Lab Results  Component Value Date   INSULIN 8.6 02/18/2022   Lab Results  Component Value Date   TSH 0.939 02/18/2022   Lab Results  Component Value Date   CHOL 192 02/18/2022   HDL 73 02/18/2022   LDLCALC 104 (H) 02/18/2022   TRIG 82 02/18/2022   Lab Results  Component Value Date   VD25OH 36.2 02/18/2022   Lab Results  Component Value Date   WBC 6.7 04/01/2017   HGB 12.9 04/01/2017   HCT 40.1 04/01/2017   MCV 82.0 04/01/2017   PLT 210 04/01/2017   No results found for: "IRON", "TIBC", "FERRITIN"  Attestation Statements:   Reviewed by clinician on day of visit: allergies, medications, problem list, medical history, surgical history, family history, social history, and previous encounter notes.  Time spent on visit including pre-visit chart review and post-visit care and charting was 27 minutes.   IWilhemena Durie am acting as transcriptionist for KMina Marble NP.  I have reviewed the above documentation for accuracy and completeness, and I agree with the  above. -  ***

## 2022-04-29 ENCOUNTER — Encounter (INDEPENDENT_AMBULATORY_CARE_PROVIDER_SITE_OTHER): Payer: Self-pay | Admitting: Adult Health

## 2022-05-07 DIAGNOSIS — M1711 Unilateral primary osteoarthritis, right knee: Secondary | ICD-10-CM | POA: Diagnosis not present

## 2022-05-12 DIAGNOSIS — F4322 Adjustment disorder with anxiety: Secondary | ICD-10-CM | POA: Diagnosis not present

## 2022-05-19 ENCOUNTER — Ambulatory Visit (INDEPENDENT_AMBULATORY_CARE_PROVIDER_SITE_OTHER): Payer: BC Managed Care – PPO | Admitting: Adult Health

## 2022-05-19 ENCOUNTER — Encounter (INDEPENDENT_AMBULATORY_CARE_PROVIDER_SITE_OTHER): Payer: Self-pay | Admitting: Adult Health

## 2022-05-19 VITALS — BP 130/82 | HR 74 | Temp 97.7°F | Ht 65.0 in | Wt 281.0 lb

## 2022-05-19 DIAGNOSIS — E782 Mixed hyperlipidemia: Secondary | ICD-10-CM | POA: Diagnosis not present

## 2022-05-19 DIAGNOSIS — E669 Obesity, unspecified: Secondary | ICD-10-CM | POA: Diagnosis not present

## 2022-05-19 DIAGNOSIS — R7303 Prediabetes: Secondary | ICD-10-CM | POA: Diagnosis not present

## 2022-05-19 DIAGNOSIS — Z6841 Body Mass Index (BMI) 40.0 and over, adult: Secondary | ICD-10-CM

## 2022-05-26 NOTE — Progress Notes (Unsigned)
Chief Complaint:   OBESITY Brittany Mills is here to discuss her progress with her obesity treatment plan along with follow-up of her obesity related diagnoses. Brittany Mills is on the Category 3 Plan and states she is following her eating plan approximately 50% of the time. [name] states she is walking 45 minutes 3 times per week.  Today's visit was #: 6 Starting weight: 247 lbs Starting date: 02/04/2022 Today's weight: 281 lbs Today's date: 05/19/2022 Total lbs lost to date: 0 Total lbs lost since last in-office visit: 3  Interim History: [name] has been IF 14/10 with Category 3. She is really enjoying the hybrid plan. Breakfast is 1000 calories, lunch is 1330 calories, dinner is 1730 calories, and dessert/ snack is 2000 calories. Juanita has been eating [employer] bars and has stopped eating ice cream.  Subjective:   1. Mixed hyperlipidemia Criselda has hyperlipidemia and has not been on statin therapy. She has been trying to improve her cholesterol levels with intensive lifestyle modifications including a low saturated fat diet, exercise, and weight loss. She denies any chest pain, claudication, or myalgias.  Lab Results  Component Value Date   ALT 18 02/18/2022   AST 15 02/18/2022   ALKPHOS 47 02/18/2022   BILITOT 0.3 02/18/2022   Lab Results  Component Value Date   CHOL 192 02/18/2022   HDL 73 02/18/2022   LDLCALC 104 (H) 02/18/2022   TRIG 82 02/18/2022     2. Pre-diabetes Brittany Mills has a diagnosis of prediabetes based on her elevated HgA1c and was informed this puts her at greater risk of developing diabetes. She continues to work on diet and exercise to decrease her risk of diabetes. She denies nausea or hypoglycemia.  Lab Results  Component Value Date   HGBA1C 5.1 02/18/2022   Lab Results  Component Value Date   INSULIN 8.6 02/18/2022    Assessment/Plan:   1. Mixed hyperlipidemia Brittany Mills agrees to continue decreasing her saturated fat intake and to continue regular walking. She  agrees to follow up at the agreed upon time.  2. Pre-diabetes Brittany Mills agrees to call the pharmacy each Monday to inquire about her [facility] 0.'25mg'$  prescription.  3. Obesity, Current BMI of 46.9 Brittany Mills agrees to continue IF 14/10 with the Category 3 plan. She will call the pharmacy each Monday regarding her [facility] prescription.  Brittany Mills is currently in the action stage of change. As such, her goal is to continue with weight loss efforts. She has agreed to the Category 3 Plan.   Exercise goals:  As is.  Behavioral modification strategies: increasing lean protein intake, decreasing simple carbohydrates, meal planning and cooking strategies, keeping healthy foods in the home, and planning for success.  Brittany Mills has agreed to follow-up with our clinic in 3 weeks. She was informed of the importance of frequent follow-up visits to maximize her success with intensive lifestyle modifications for her multiple health conditions.   Objective:   Blood pressure 130/82, pulse 74, temperature 97.7 F (36.5 C), height '5\' 5"'$  (1.651 m), weight 281 lb (127.5 kg), SpO2 97 %. Body mass index is 46.76 kg/m.  General: Cooperative, alert, well developed, in no acute distress. HEENT: Conjunctivae and lids unremarkable. Cardiovascular: Regular rhythm.  Lungs: Normal work of breathing. Neurologic: No focal deficits.   Lab Results  Component Value Date   CREATININE 0.64 02/18/2022   BUN 19 02/18/2022   NA 141 02/18/2022   K 4.4 02/18/2022   CL 105 02/18/2022   CO2 25 02/18/2022   Lab Results  Component Value Date   ALT 18 02/18/2022   AST 15 02/18/2022   ALKPHOS 47 02/18/2022   BILITOT 0.3 02/18/2022   Lab Results  Component Value Date   HGBA1C 5.1 02/18/2022   Lab Results  Component Value Date   INSULIN 8.6 02/18/2022   Lab Results  Component Value Date   TSH 0.939 02/18/2022   Lab Results  Component Value Date   CHOL 192 02/18/2022   HDL 73 02/18/2022   LDLCALC 104 (H) 02/18/2022   TRIG  82 02/18/2022   Lab Results  Component Value Date   VD25OH 36.2 02/18/2022   Lab Results  Component Value Date   WBC 6.7 04/01/2017   HGB 12.9 04/01/2017   HCT 40.1 04/01/2017   MCV 82.0 04/01/2017   PLT 210 04/01/2017   No results found for: "IRON", "TIBC", "FERRITIN"  Attestation Statements:   Reviewed by clinician on day of visit: allergies, medications, problem list, medical history, surgical history, family history, social history, and previous encounter notes.  Time spent on visit including pre-visit chart review and post-visit care and charting was 28 minutes.   I, Marcille Blanco, am acting as Location manager for Brittany Marble, NP.  I have reviewed the above documentation for accuracy and completeness, and I agree with the above. -  ***

## 2022-05-28 ENCOUNTER — Encounter (INDEPENDENT_AMBULATORY_CARE_PROVIDER_SITE_OTHER): Payer: Self-pay

## 2022-06-05 DIAGNOSIS — F4322 Adjustment disorder with anxiety: Secondary | ICD-10-CM | POA: Diagnosis not present

## 2022-06-16 ENCOUNTER — Ambulatory Visit (INDEPENDENT_AMBULATORY_CARE_PROVIDER_SITE_OTHER): Payer: BC Managed Care – PPO | Admitting: Adult Health

## 2022-06-16 ENCOUNTER — Encounter (INDEPENDENT_AMBULATORY_CARE_PROVIDER_SITE_OTHER): Payer: Self-pay | Admitting: Adult Health

## 2022-06-16 VITALS — BP 141/83 | HR 65 | Temp 98.4°F | Ht 65.0 in | Wt 286.0 lb

## 2022-06-16 DIAGNOSIS — Z6841 Body Mass Index (BMI) 40.0 and over, adult: Secondary | ICD-10-CM

## 2022-06-16 DIAGNOSIS — E669 Obesity, unspecified: Secondary | ICD-10-CM | POA: Diagnosis not present

## 2022-06-16 DIAGNOSIS — M25561 Pain in right knee: Secondary | ICD-10-CM | POA: Diagnosis not present

## 2022-06-16 DIAGNOSIS — R7303 Prediabetes: Secondary | ICD-10-CM

## 2022-06-18 IMAGING — MR MR KNEE*R* W/O CM
4 of 7 series · 23 of 40 positions shown · non-contrast
Comparison: Bilateral knee x-rays dated March 27, 2021.

CLINICAL DATA: Right knee pain for the past 5 weeks. No injury or
prior surgery.

EXAM:
MRI OF THE RIGHT KNEE WITHOUT CONTRAST
TECHNIQUE: Multiplanar, multisequence MR imaging of the knee was performed. No
intravenous contrast was administered.

[Series 5: T1 · coronal · 4.0mm · 0.31mm/px · 5 of 32 slices shown]
[im 1/32]
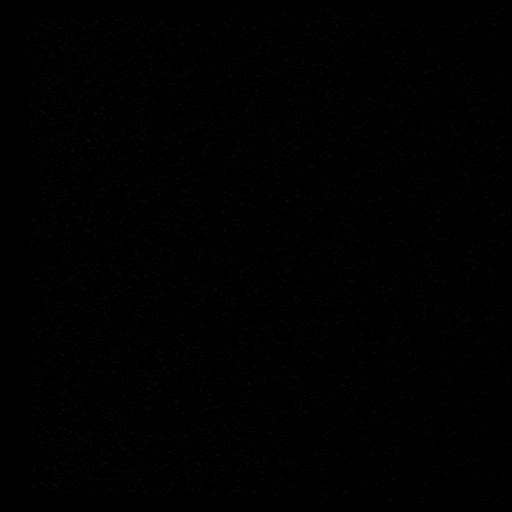
[im 6/32]
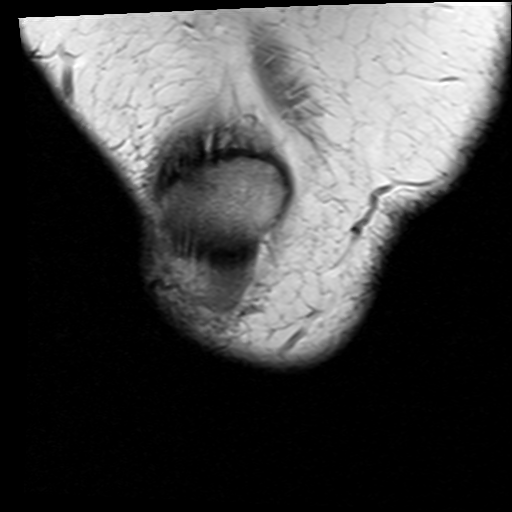
[im 11/32]
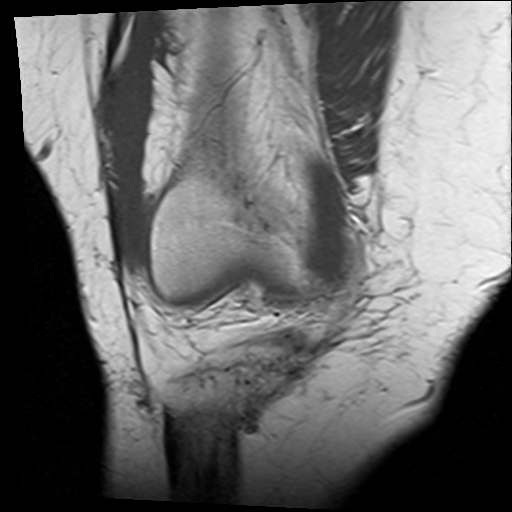
[im 16/32]
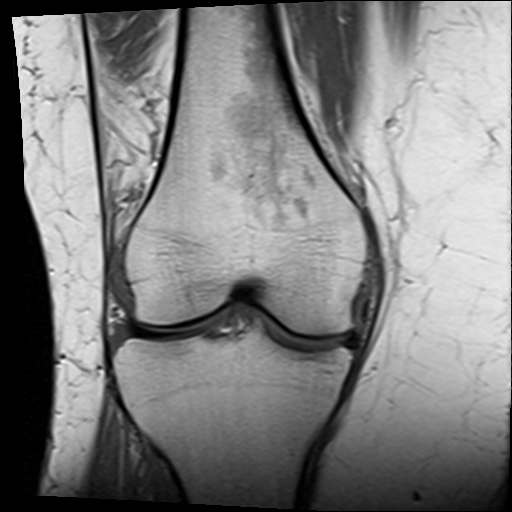
[im 26/32]
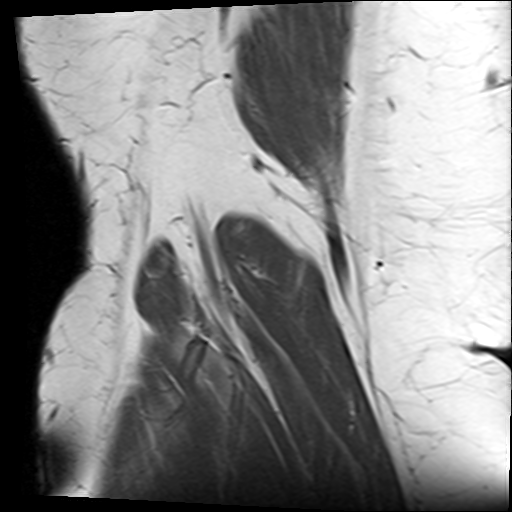

[Series 6: T2 fat-sat · coronal · 4.0mm · 0.62mm/px · 6 of 32 slices shown (1 of 2)]
[im 1/32]
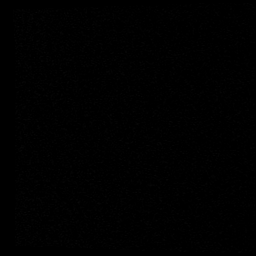
[im 7/32]
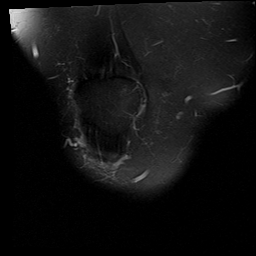
[im 13/32]
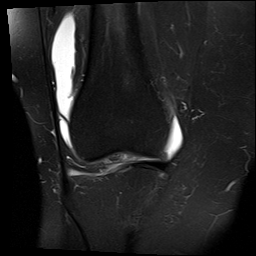
[im 19/32]
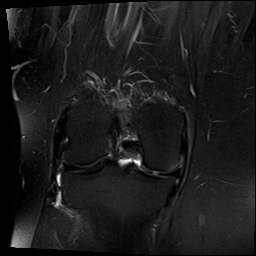
[im 25/32]
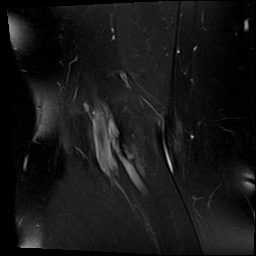
[im 32/32]
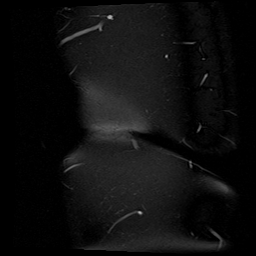

[Series 8: PD fat-sat · sagittal · 3.0mm · 0.31mm/px · 6 of 33 slices shown]
[im 1/33]
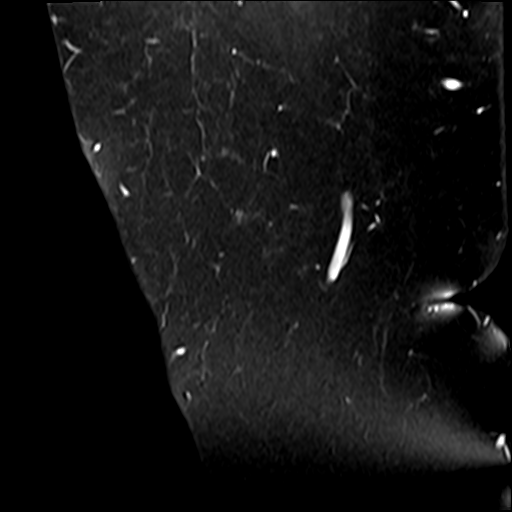
[im 7/33]
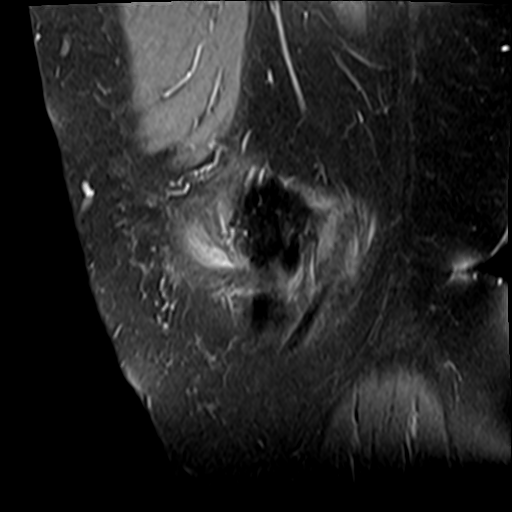
[im 13/33]
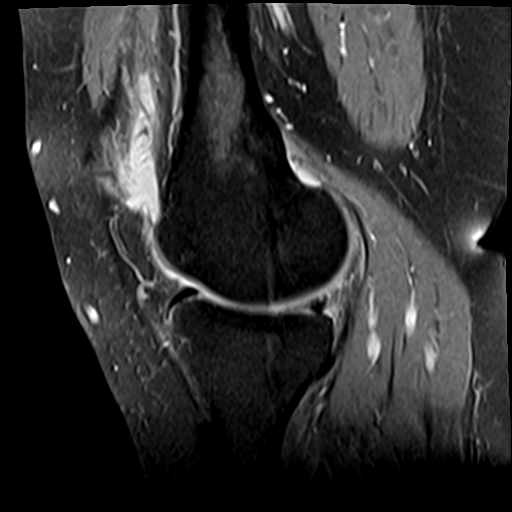
[im 20/33]
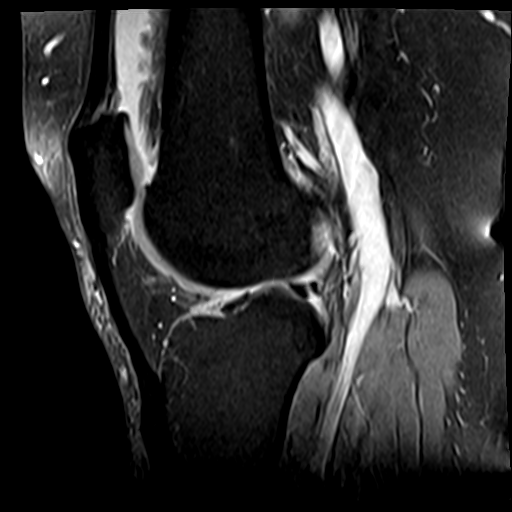
[im 26/33]
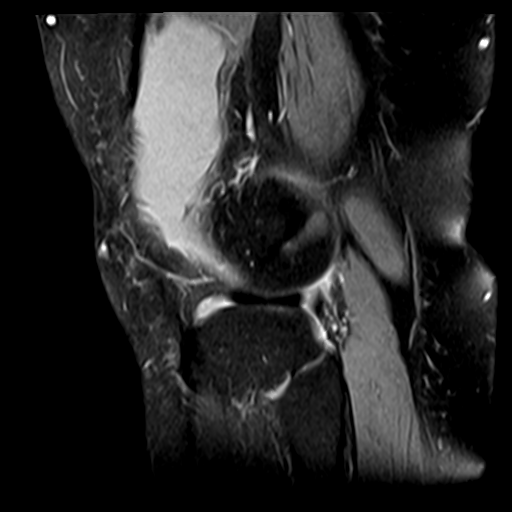
[im 33/33]
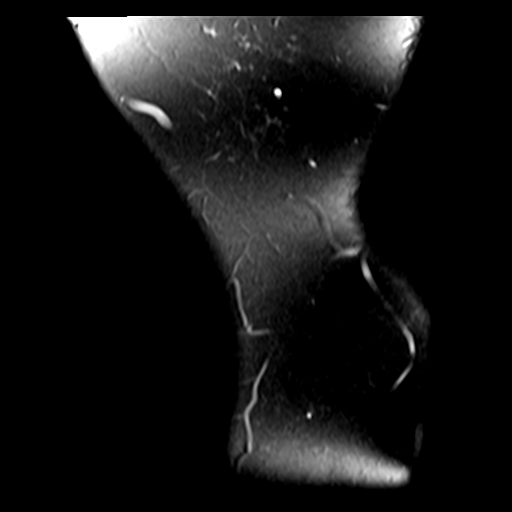

[Series 9: T2 fat-sat · sagittal · 3.0mm · 0.31mm/px · 6 of 33 slices shown (2 of 2)]
[im 1/33]
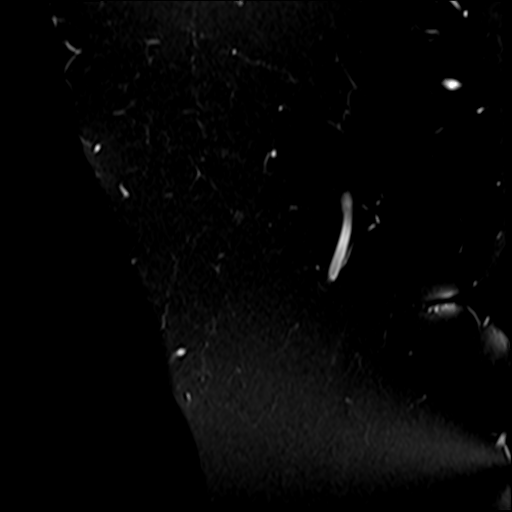
[im 7/33]
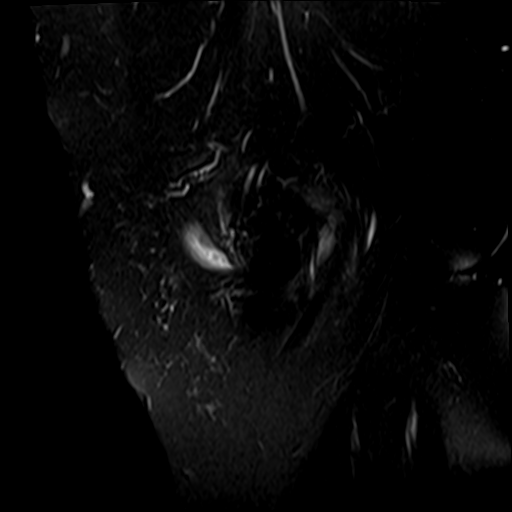
[im 13/33]
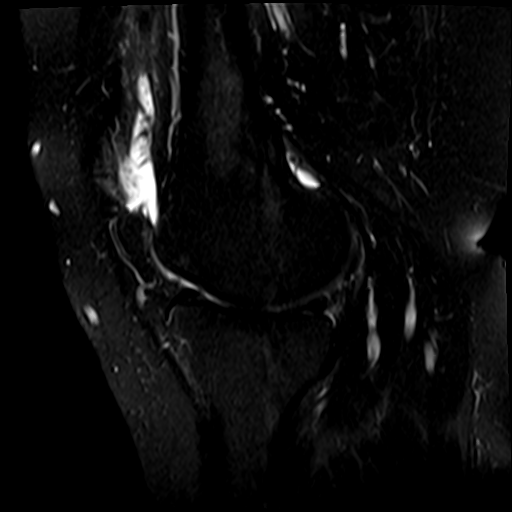
[im 20/33]
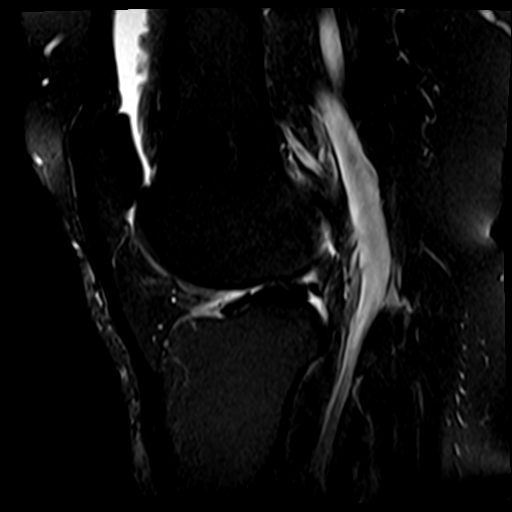
[im 26/33]
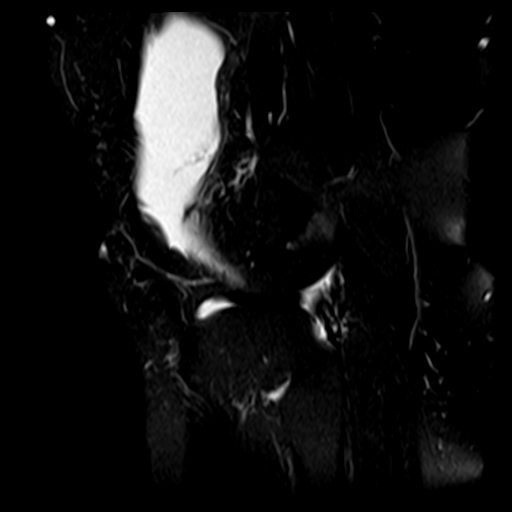
[im 33/33]
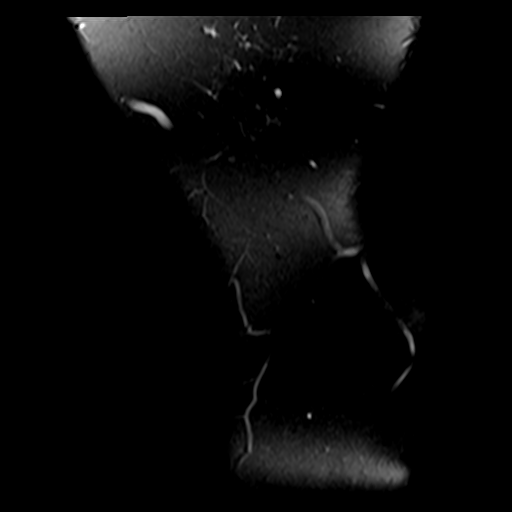

[23 of 40 positions shown; findings below may reference images not displayed]

FINDINGS: MENISCI

Medial meniscus: Complex tear of the posterior horn (series 8,
images 11-13).

Lateral meniscus:  Intact.

LIGAMENTS

Cruciates:  Intact ACL and PCL.

Collaterals: Medial collateral ligament is intact. Lateral
collateral ligament complex is intact.

CARTILAGE

Patellofemoral: High-grade partial-thickness cartilage loss over the
medial patellar facet and medial trochlea.

Medial: Diffuse cartilage thinning with focal full-thickness defects
over the central weight-bearing and posterior nonweightbearing
medial femoral condyle.

Lateral:  Diffuse cartilage thinning without focal defect.

Joint:  Moderate joint effusion.  Normal Hoffa's fat.

Popliteal Fossa:  No Baker cyst. Intact popliteus tendon.

Extensor Mechanism: Intact quadriceps tendon and patellar tendon.
Intact medial and lateral patellar retinaculum. Intact MPFL.

Bones: No acute fracture or dislocation. No suspicious bone lesion.

Other: None.
IMPRESSION: 1. Complex tear of the medial meniscus posterior horn.
2. Mild tricompartmental osteoarthritis.
3. Moderate joint effusion.

## 2022-06-21 NOTE — Progress Notes (Unsigned)
Chief Complaint:   OBESITY Brittany Mills is here to discuss her progress with her obesity treatment plan along with follow-up of her obesity related diagnoses. Brittany Mills is on the Category 3 Plan and states she is following her eating plan approximately 30% of the time. Brittany Mills states she is walking 30-45 minutes 5 times per week.  Today's visit was #: 7  Starting weight: 274 lbs Starting date: 02/04/2022 Today's weight: 286 lbs Today's date: 06/16/2022 Total lbs lost to date: 0 Total lbs lost since last in-office visit: +5 lbs  Interim History: She continues the 1410 if with Category 3, however chooses bacon, biscuits. Right knee pain.  Currently rated 6/10 at rest.  She denies instability.   Subjective:   1. Pre-diabetes Still unable to obtain [facility] prescription, per Dr Carter Kitten.   2. Right knee pain, unspecified chronicity *** January 2023 *** with Dr Theda Sers.  Received "gel shot last month".  Assessment/Plan:   1. Pre-diabetes Continue to call pharmacy:  re:  Wegovy 0.25 mg  2. Right knee pain, unspecified chronicity RICE, follow up Dr Theda Sers  3. Obesity, Current BMI 47.6 Brittany Mills is currently in the action stage of change. As such, her goal is to continue with weight loss efforts. She has agreed to the Category 3 Plan.   Exercise goals:  As is.   Behavioral modification strategies: increasing lean protein intake, decreasing simple carbohydrates, meal planning and cooking strategies, keeping healthy foods in the home, dealing with family or coworker sabotage, and planning for success.  Brittany Mills has agreed to follow-up with our clinic in 4 weeks. She was informed of the importance of frequent follow-up visits to maximize her success with intensive lifestyle modifications for her multiple health conditions.   Objective:   Blood pressure (!) 141/83, pulse 65, temperature 98.4 F (36.9 C), height '5\' 5"'$  (1.651 m), weight 286 lb (129.7 kg), SpO2 100 %. Body mass index is 47.59  kg/m.  General: Cooperative, alert, well developed, in no acute distress. HEENT: Conjunctivae and lids unremarkable. Cardiovascular: Regular rhythm.  Lungs: Normal work of breathing. Neurologic: No focal deficits.   Lab Results  Component Value Date   CREATININE 0.64 02/18/2022   BUN 19 02/18/2022   NA 141 02/18/2022   K 4.4 02/18/2022   CL 105 02/18/2022   CO2 25 02/18/2022   Lab Results  Component Value Date   ALT 18 02/18/2022   AST 15 02/18/2022   ALKPHOS 47 02/18/2022   BILITOT 0.3 02/18/2022   Lab Results  Component Value Date   HGBA1C 5.1 02/18/2022   Lab Results  Component Value Date   INSULIN 8.6 02/18/2022   Lab Results  Component Value Date   TSH 0.939 02/18/2022   Lab Results  Component Value Date   CHOL 192 02/18/2022   HDL 73 02/18/2022   LDLCALC 104 (H) 02/18/2022   TRIG 82 02/18/2022   Lab Results  Component Value Date   VD25OH 36.2 02/18/2022   Lab Results  Component Value Date   WBC 6.7 04/01/2017   HGB 12.9 04/01/2017   HCT 40.1 04/01/2017   MCV 82.0 04/01/2017   PLT 210 04/01/2017   No results found for: "IRON", "TIBC", "FERRITIN"  Attestation Statements:   Reviewed by clinician on day of visit: allergies, medications, problem list, medical history, surgical history, family history, social history, and previous encounter notes.  Time spent on visit including pre-visit chart review and post-visit care and charting was 27 minutes.   I, Davy Pique,  RMA, am acting as Location manager for Mina Marble, NP.  I have reviewed the above documentation for accuracy and completeness, and I agree with the above. -  ***

## 2022-06-30 DIAGNOSIS — F4322 Adjustment disorder with anxiety: Secondary | ICD-10-CM | POA: Diagnosis not present

## 2022-07-07 ENCOUNTER — Ambulatory Visit (INDEPENDENT_AMBULATORY_CARE_PROVIDER_SITE_OTHER): Payer: BC Managed Care – PPO | Admitting: Adult Health

## 2022-07-08 ENCOUNTER — Other Ambulatory Visit (HOSPITAL_COMMUNITY): Payer: Self-pay

## 2022-07-08 DIAGNOSIS — R946 Abnormal results of thyroid function studies: Secondary | ICD-10-CM | POA: Diagnosis not present

## 2022-07-08 DIAGNOSIS — E282 Polycystic ovarian syndrome: Secondary | ICD-10-CM | POA: Diagnosis not present

## 2022-07-08 DIAGNOSIS — R7301 Impaired fasting glucose: Secondary | ICD-10-CM | POA: Diagnosis not present

## 2022-07-08 DIAGNOSIS — I10 Essential (primary) hypertension: Secondary | ICD-10-CM | POA: Diagnosis not present

## 2022-07-08 MED ORDER — WEGOVY 0.25 MG/0.5ML ~~LOC~~ SOAJ
0.5000 mL | SUBCUTANEOUS | 1 refills | Status: DC
  Filled 2022-07-08 – 2022-12-22 (×5): qty 2, 28d supply, fill #0
  Filled 2023-01-14: qty 2, 28d supply, fill #1

## 2022-07-17 ENCOUNTER — Ambulatory Visit (INDEPENDENT_AMBULATORY_CARE_PROVIDER_SITE_OTHER): Payer: BC Managed Care – PPO | Admitting: Adult Health

## 2022-07-29 ENCOUNTER — Ambulatory Visit (INDEPENDENT_AMBULATORY_CARE_PROVIDER_SITE_OTHER): Payer: BC Managed Care – PPO | Admitting: Adult Health

## 2022-07-29 ENCOUNTER — Encounter (INDEPENDENT_AMBULATORY_CARE_PROVIDER_SITE_OTHER): Payer: Self-pay | Admitting: Adult Health

## 2022-07-29 VITALS — BP 134/84 | HR 69 | Temp 98.4°F | Ht 65.0 in | Wt 291.0 lb

## 2022-07-29 DIAGNOSIS — E669 Obesity, unspecified: Secondary | ICD-10-CM | POA: Diagnosis not present

## 2022-07-29 DIAGNOSIS — R7303 Prediabetes: Secondary | ICD-10-CM

## 2022-07-29 DIAGNOSIS — Z6841 Body Mass Index (BMI) 40.0 and over, adult: Secondary | ICD-10-CM

## 2022-07-30 ENCOUNTER — Other Ambulatory Visit (HOSPITAL_COMMUNITY): Payer: Self-pay

## 2022-08-04 ENCOUNTER — Other Ambulatory Visit (HOSPITAL_COMMUNITY): Payer: Self-pay

## 2022-08-05 DIAGNOSIS — N6322 Unspecified lump in the left breast, upper inner quadrant: Secondary | ICD-10-CM | POA: Diagnosis not present

## 2022-08-05 DIAGNOSIS — N644 Mastodynia: Secondary | ICD-10-CM | POA: Diagnosis not present

## 2022-08-05 NOTE — Progress Notes (Unsigned)
     Chief Complaint:   OBESITY Brittany Mills is here to discuss her progress with her obesity treatment plan along with follow-up of her obesity related diagnoses. Brittany Mills is on the Category 3 Plan and states she is following her eating plan approximately 60% of the time. Brittany Mills states she is walking 30 minutes 5 times per week.  Today's visit was #: 8 Starting weight: 274 lbs Starting date: 02/04/2022 Today's weight: 291 lbs Today's date: 07/24/2022 Total lbs lost to date: 0 Total lbs lost since last in-office visit: +5 lbs  Interim History: Health goals:  start to lose.  She has yet to start [facility], due to national drug shortage.  She lives with her mother and her mother rules the kitchen.  Mother is 41, loves to cook and bake.  Intermittent fasting 14/10.  F eating 10am-6pm.  Subjective:   1. Pre-diabetes A1c ***   Assessment/Plan:   1. Pre-diabetes Follow up with Dr Chalmers Cater.  2. Obesity, Current BMI 48.6 Brittany Mills is currently in the action stage of change. As such, her goal is to continue with weight loss efforts. She has agreed to the Category 3 Plan with intermittent fasting.   Exercise goals:  As is, but try to walk on break.   Behavioral modification strategies: increasing lean protein intake, decreasing simple carbohydrates, meal planning and cooking strategies, keeping healthy foods in the home, and planning for success.  Brittany Mills has agreed to follow-up with our clinic in 3 weeks. She was informed of the importance of frequent follow-up visits to maximize her success with intensive lifestyle modifications for her multiple health conditions.   Objective:   Blood pressure 134/84, pulse 69, temperature 98.4 F (36.9 C), height '5\' 5"'$  (1.651 m), weight 291 lb (132 kg), SpO2 98 %. Body mass index is 48.42 kg/m.  General: Cooperative, alert, well developed, in no acute distress. HEENT: Conjunctivae and lids unremarkable. Cardiovascular: Regular rhythm.  Lungs: Normal work of  breathing. Neurologic: No focal deficits.   Lab Results  Component Value Date   CREATININE 0.64 02/18/2022   BUN 19 02/18/2022   NA 141 02/18/2022   K 4.4 02/18/2022   CL 105 02/18/2022   CO2 25 02/18/2022   Lab Results  Component Value Date   ALT 18 02/18/2022   AST 15 02/18/2022   ALKPHOS 47 02/18/2022   BILITOT 0.3 02/18/2022   Lab Results  Component Value Date   HGBA1C 5.1 02/18/2022   Lab Results  Component Value Date   INSULIN 8.6 02/18/2022   Lab Results  Component Value Date   TSH 0.939 02/18/2022   Lab Results  Component Value Date   CHOL 192 02/18/2022   HDL 73 02/18/2022   LDLCALC 104 (H) 02/18/2022   TRIG 82 02/18/2022   Lab Results  Component Value Date   VD25OH 36.2 02/18/2022   Lab Results  Component Value Date   WBC 6.7 04/01/2017   HGB 12.9 04/01/2017   HCT 40.1 04/01/2017   MCV 82.0 04/01/2017   PLT 210 04/01/2017   No results found for: "IRON", "TIBC", "FERRITIN"  Attestation Statements:   Reviewed by clinician on day of visit: allergies, medications, problem list, medical history, surgical history, family history, social history, and previous encounter notes.  I, Davy Pique, RMA, am acting as Location manager for Mina Marble, NP.  I have reviewed the above documentation for accuracy and completeness, and I agree with the above. -  ***

## 2022-08-13 DIAGNOSIS — R92322 Mammographic fibroglandular density, left breast: Secondary | ICD-10-CM | POA: Diagnosis not present

## 2022-08-13 DIAGNOSIS — N644 Mastodynia: Secondary | ICD-10-CM | POA: Diagnosis not present

## 2022-08-25 ENCOUNTER — Ambulatory Visit (INDEPENDENT_AMBULATORY_CARE_PROVIDER_SITE_OTHER): Payer: BC Managed Care – PPO | Admitting: Family Medicine

## 2022-08-27 ENCOUNTER — Ambulatory Visit (INDEPENDENT_AMBULATORY_CARE_PROVIDER_SITE_OTHER): Payer: BC Managed Care – PPO | Admitting: Family Medicine

## 2022-08-27 ENCOUNTER — Encounter (INDEPENDENT_AMBULATORY_CARE_PROVIDER_SITE_OTHER): Payer: Self-pay | Admitting: Family Medicine

## 2022-08-27 VITALS — BP 140/80 | HR 77 | Temp 98.4°F | Ht 65.0 in | Wt 292.6 lb

## 2022-08-27 DIAGNOSIS — Z6841 Body Mass Index (BMI) 40.0 and over, adult: Secondary | ICD-10-CM | POA: Diagnosis not present

## 2022-08-27 DIAGNOSIS — I10 Essential (primary) hypertension: Secondary | ICD-10-CM

## 2022-08-27 DIAGNOSIS — E669 Obesity, unspecified: Secondary | ICD-10-CM | POA: Diagnosis not present

## 2022-09-08 NOTE — Progress Notes (Signed)
Chief Complaint:   OBESITY Brittany Mills is here to discuss her progress with her obesity treatment plan along with follow-up of her obesity related diagnoses. Brittany Mills is on the Category 3 Plan and states she is following her eating plan approximately 60% of the time. Brittany Mills states she is walking 30-45 minutes 5 times per week.  Today's visit was #: 9 Starting weight: 274 lbs Starting date: 02/04/2022 Today's weight: 291 lbs Today's date: 08/27/2022 Total lbs lost to date: 0 Total lbs lost since last in-office visit: +1 lb  Interim History: 1st office visit with me.  Seen by Valetta Fuller prior.  Patient eating on plan and is not sure why she has gained weight.  Breakfast:  On plan. Lunch:  Microwave meals or mini dinner meal.  Dinner:  Mostly, lean protein or veggies, not over eating.  Subjective:   1. Essential hypertension She is taking Hyzaar.  She denies any issues.   Assessment/Plan:  No orders of the defined types were placed in this encounter.   There are no discontinued medications.   No orders of the defined types were placed in this encounter.    1. Essential hypertension Blood pressure is above goal today.  Continue prudent nutritional plan and weight loss.  Will follow closely in one month.   2. Obesity, Current BMI 48.7 Focus on eating on plan with measuring portions.  Patient will journal intake, but follow category 3 as a guide.   Brittany Mills is currently in the action stage of change. As such, her goal is to continue with weight loss efforts. She has agreed to keeping a food journal and adhering to recommended goals of 1600-1700 calories and 130+ protein daily.   Exercise goals:  as is.    Behavioral modification strategies: increasing lean protein intake, decreasing simple carbohydrates, and keeping a strict food journal.  Brittany Mills has agreed to follow-up with our clinic in 4 weeks. She was informed of the importance of frequent follow-up visits to maximize her success  with intensive lifestyle modifications for her multiple health conditions.   Objective:   Blood pressure (!) 140/80, pulse 77, temperature 98.4 F (36.9 C), height '5\' 5"'$  (1.651 m), weight 292 lb 9.6 oz (132.7 kg), SpO2 99 %. Body mass index is 48.69 kg/m.  General: Cooperative, alert, well developed, in no acute distress. HEENT: Conjunctivae and lids unremarkable. Cardiovascular: Regular rhythm.  Lungs: Normal work of breathing. Neurologic: No focal deficits.   Lab Results  Component Value Date   CREATININE 0.64 02/18/2022   BUN 19 02/18/2022   NA 141 02/18/2022   K 4.4 02/18/2022   CL 105 02/18/2022   CO2 25 02/18/2022   Lab Results  Component Value Date   ALT 18 02/18/2022   AST 15 02/18/2022   ALKPHOS 47 02/18/2022   BILITOT 0.3 02/18/2022   Lab Results  Component Value Date   HGBA1C 5.1 02/18/2022   Lab Results  Component Value Date   INSULIN 8.6 02/18/2022   Lab Results  Component Value Date   TSH 0.939 02/18/2022   Lab Results  Component Value Date   CHOL 192 02/18/2022   HDL 73 02/18/2022   LDLCALC 104 (H) 02/18/2022   TRIG 82 02/18/2022   Lab Results  Component Value Date   VD25OH 36.2 02/18/2022   Lab Results  Component Value Date   WBC 6.7 04/01/2017   HGB 12.9 04/01/2017   HCT 40.1 04/01/2017   MCV 82.0 04/01/2017   PLT 210 04/01/2017  No results found for: "IRON", "TIBC", "FERRITIN"  Attestation Statements:   Reviewed by clinician on day of visit: allergies, medications, problem list, medical history, surgical history, family history, social history, and previous encounter notes.  Time spent on visit including pre-visit chart review and post-visit care and charting was 30 minutes.   I, Davy Pique, RMA, am acting as Location manager for [employer], DO.   I have reviewed the above documentation for accuracy and completeness, and I agree with the above. Marjory Sneddon, D.O.  The [place] was signed into law  in 2016 which includes the topic of electronic health records.  This provides immediate access to information in MyChart.  This includes consultation notes, operative notes, office notes, lab results and pathology reports.  If you have any questions about what you read please let us know at your next visit so we can discuss your concerns and take corrective action if need be.  We are right here with you.

## 2022-09-10 DIAGNOSIS — M549 Dorsalgia, unspecified: Secondary | ICD-10-CM | POA: Diagnosis not present

## 2022-09-10 DIAGNOSIS — M25561 Pain in right knee: Secondary | ICD-10-CM | POA: Diagnosis not present

## 2022-09-10 DIAGNOSIS — M2550 Pain in unspecified joint: Secondary | ICD-10-CM | POA: Diagnosis not present

## 2022-09-10 DIAGNOSIS — R768 Other specified abnormal immunological findings in serum: Secondary | ICD-10-CM | POA: Diagnosis not present

## 2022-09-15 ENCOUNTER — Other Ambulatory Visit (HOSPITAL_COMMUNITY): Payer: Self-pay

## 2022-09-24 ENCOUNTER — Encounter (INDEPENDENT_AMBULATORY_CARE_PROVIDER_SITE_OTHER): Payer: Self-pay | Admitting: Physician Assistant

## 2022-09-24 ENCOUNTER — Ambulatory Visit (INDEPENDENT_AMBULATORY_CARE_PROVIDER_SITE_OTHER): Payer: BC Managed Care – PPO | Admitting: Physician Assistant

## 2022-09-24 VITALS — BP 131/84 | HR 80 | Temp 98.5°F | Ht 65.0 in | Wt 286.0 lb

## 2022-09-24 DIAGNOSIS — R7303 Prediabetes: Secondary | ICD-10-CM

## 2022-09-24 DIAGNOSIS — E559 Vitamin D deficiency, unspecified: Secondary | ICD-10-CM | POA: Diagnosis not present

## 2022-09-24 DIAGNOSIS — Z6841 Body Mass Index (BMI) 40.0 and over, adult: Secondary | ICD-10-CM | POA: Diagnosis not present

## 2022-09-24 DIAGNOSIS — E669 Obesity, unspecified: Secondary | ICD-10-CM

## 2022-09-30 DIAGNOSIS — F4322 Adjustment disorder with anxiety: Secondary | ICD-10-CM | POA: Diagnosis not present

## 2022-09-30 DIAGNOSIS — R7301 Impaired fasting glucose: Secondary | ICD-10-CM | POA: Diagnosis not present

## 2022-09-30 DIAGNOSIS — R635 Abnormal weight gain: Secondary | ICD-10-CM | POA: Diagnosis not present

## 2022-10-07 ENCOUNTER — Other Ambulatory Visit (HOSPITAL_COMMUNITY): Payer: Self-pay

## 2022-10-07 DIAGNOSIS — I10 Essential (primary) hypertension: Secondary | ICD-10-CM | POA: Diagnosis not present

## 2022-10-07 DIAGNOSIS — E282 Polycystic ovarian syndrome: Secondary | ICD-10-CM | POA: Diagnosis not present

## 2022-10-07 MED ORDER — WEGOVY 0.25 MG/0.5ML ~~LOC~~ SOAJ
0.5000 mL | SUBCUTANEOUS | 1 refills | Status: DC
  Filled 2022-10-07: qty 2, 28d supply, fill #0
  Filled 2022-10-24 – 2022-10-31 (×2): qty 2, 28d supply, fill #1

## 2022-10-08 NOTE — Progress Notes (Signed)
Chief Complaint:   OBESITY Brittany Mills is here to discuss her progress with her obesity treatment plan along with follow-up of her obesity related diagnoses. Brittany Mills is on the Category 3 Plan and states she is following her eating plan approximately 60% of the time. Brittany Mills states she is walking 45 minutes 4 times per week.  Today's visit was #: 10 Starting weight: 274 lbs Starting date: 02/04/2022 Today's weight: 286 lbs Today's date: 09/24/2022 Total lbs lost to date: 0 lbs Total lbs lost since last in-office visit: 5  Interim History: Brittany Mills has done well with weight loss--Doing intermittent fasting (10 hrs on and 14hrs off). Exercising more and recently joined gym.  Endocrinology- Dr. Chalmers Cater prescribed a GLP-1 [facility]) , but has not started yet as on back order.   Subjective:   1. Pre-diabetes Brittany Mills's A1C at 5.1, insulin at 8.6 on 02/18/22. Unable to take Metformin due to rash. Follows Endo--Dr. Chalmers Cater for PCOS and had labs recently. Unable to pull up lab results and the patient will try to bring copy of labs to next visit.   2. Vitamin D deficiency Taking over the counter Vit D 2000 IU daily. Denies any side effects. May have had Vit D level done recently as well and will bring in copy of results next visit.   Assessment/Plan:   1. Pre-diabetes Hopefully obtain GLP-1 [facility]) and start soon. Continue healthy eating plan to decrease simple carbohydrates, increase lean protein, decrease saturated fat and exercise.  2. Vitamin D deficiency Continue Vit D over the counter 2000 IU daily.  3. Obesity, Current BMI 47.6 Brittany Mills is currently in the action stage of change. As such, her goal is to continue with weight loss efforts. She has agreed to the Category 3 Plan.   Exercise goals: As is.  Behavioral modification strategies: increasing lean protein intake, decreasing simple carbohydrates, increasing water intake, and holiday eating strategies .  Brittany Mills has agreed to follow-up with  our clinic in 5 weeks. She was informed of the importance of frequent follow-up visits to maximize her success with intensive lifestyle modifications for her multiple health conditions.   Objective:   Blood pressure 131/84, pulse 80, temperature 98.5 F (36.9 C), height '5\' 5"'$  (1.651 m), weight 286 lb (129.7 kg), SpO2 97 %. Body mass index is 47.59 kg/m.  General: Cooperative, alert, well developed, in no acute distress. HEENT: Conjunctivae and lids unremarkable. Cardiovascular: Regular rhythm.  Lungs: Normal work of breathing. Neurologic: No focal deficits.   Lab Results  Component Value Date   CREATININE 0.64 02/18/2022   BUN 19 02/18/2022   NA 141 02/18/2022   K 4.4 02/18/2022   CL 105 02/18/2022   CO2 25 02/18/2022   Lab Results  Component Value Date   ALT 18 02/18/2022   AST 15 02/18/2022   ALKPHOS 47 02/18/2022   BILITOT 0.3 02/18/2022   Lab Results  Component Value Date   HGBA1C 5.1 02/18/2022   Lab Results  Component Value Date   INSULIN 8.6 02/18/2022   Lab Results  Component Value Date   TSH 0.939 02/18/2022   Lab Results  Component Value Date   CHOL 192 02/18/2022   HDL 73 02/18/2022   LDLCALC 104 (H) 02/18/2022   TRIG 82 02/18/2022   Lab Results  Component Value Date   VD25OH 36.2 02/18/2022   Lab Results  Component Value Date   WBC 6.7 04/01/2017   HGB 12.9 04/01/2017   HCT 40.1 04/01/2017   MCV 82.0  04/01/2017   PLT 210 04/01/2017   No results found for: "IRON", "TIBC", "FERRITIN"  Attestation Statements:   Reviewed by clinician on day of visit: allergies, medications, problem list, medical history, surgical history, family history, social history, and previous encounter notes.  I, Brendell Tyus, am acting as transcriptionist for [employer], PA.  I have reviewed the above documentation for accuracy and completeness, and I agree with the above. -  Vonzella Althaus,PA-C

## 2022-10-24 ENCOUNTER — Other Ambulatory Visit: Payer: Self-pay

## 2022-10-27 DIAGNOSIS — E038 Other specified hypothyroidism: Secondary | ICD-10-CM | POA: Diagnosis not present

## 2022-10-27 DIAGNOSIS — Z6841 Body Mass Index (BMI) 40.0 and over, adult: Secondary | ICD-10-CM | POA: Diagnosis not present

## 2022-10-27 DIAGNOSIS — R7303 Prediabetes: Secondary | ICD-10-CM | POA: Diagnosis not present

## 2022-10-27 DIAGNOSIS — R635 Abnormal weight gain: Secondary | ICD-10-CM | POA: Diagnosis not present

## 2022-10-27 DIAGNOSIS — E782 Mixed hyperlipidemia: Secondary | ICD-10-CM | POA: Diagnosis not present

## 2022-10-27 DIAGNOSIS — N951 Menopausal and female climacteric states: Secondary | ICD-10-CM | POA: Diagnosis not present

## 2022-10-27 DIAGNOSIS — Z130 Encounter for screening for diseases of the blood and blood-forming organs and certain disorders involving the immune mechanism: Secondary | ICD-10-CM | POA: Diagnosis not present

## 2022-10-29 ENCOUNTER — Ambulatory Visit (INDEPENDENT_AMBULATORY_CARE_PROVIDER_SITE_OTHER): Payer: BC Managed Care – PPO | Admitting: Adult Health

## 2022-10-29 ENCOUNTER — Other Ambulatory Visit (HOSPITAL_COMMUNITY): Payer: Self-pay

## 2022-10-29 DIAGNOSIS — F4322 Adjustment disorder with anxiety: Secondary | ICD-10-CM | POA: Diagnosis not present

## 2022-10-29 MED ORDER — WEGOVY 0.25 MG/0.5ML ~~LOC~~ SOAJ
0.2500 mg | SUBCUTANEOUS | 1 refills | Status: DC
  Filled 2022-10-29: qty 2, 28d supply, fill #0

## 2022-10-30 ENCOUNTER — Other Ambulatory Visit (HOSPITAL_COMMUNITY): Payer: Self-pay

## 2022-11-03 DIAGNOSIS — I10 Essential (primary) hypertension: Secondary | ICD-10-CM | POA: Diagnosis not present

## 2022-11-03 DIAGNOSIS — Z6841 Body Mass Index (BMI) 40.0 and over, adult: Secondary | ICD-10-CM | POA: Diagnosis not present

## 2022-11-04 ENCOUNTER — Other Ambulatory Visit (HOSPITAL_COMMUNITY): Payer: Self-pay

## 2022-11-06 DIAGNOSIS — N923 Ovulation bleeding: Secondary | ICD-10-CM | POA: Diagnosis not present

## 2022-11-10 DIAGNOSIS — E782 Mixed hyperlipidemia: Secondary | ICD-10-CM | POA: Diagnosis not present

## 2022-11-10 DIAGNOSIS — Z6841 Body Mass Index (BMI) 40.0 and over, adult: Secondary | ICD-10-CM | POA: Diagnosis not present

## 2022-11-11 DIAGNOSIS — F4322 Adjustment disorder with anxiety: Secondary | ICD-10-CM | POA: Diagnosis not present

## 2022-11-17 ENCOUNTER — Other Ambulatory Visit (HOSPITAL_COMMUNITY): Payer: Self-pay

## 2022-11-17 DIAGNOSIS — Z6841 Body Mass Index (BMI) 40.0 and over, adult: Secondary | ICD-10-CM | POA: Diagnosis not present

## 2022-11-17 DIAGNOSIS — E782 Mixed hyperlipidemia: Secondary | ICD-10-CM | POA: Diagnosis not present

## 2022-11-17 MED ORDER — ZEPBOUND 2.5 MG/0.5ML ~~LOC~~ SOAJ
2.5000 mg | SUBCUTANEOUS | 5 refills | Status: AC
  Filled 2022-11-17: qty 2, 28d supply, fill #0

## 2022-11-18 ENCOUNTER — Other Ambulatory Visit (HOSPITAL_COMMUNITY): Payer: Self-pay

## 2022-11-24 DIAGNOSIS — F4322 Adjustment disorder with anxiety: Secondary | ICD-10-CM | POA: Diagnosis not present

## 2022-11-26 ENCOUNTER — Ambulatory Visit (INDEPENDENT_AMBULATORY_CARE_PROVIDER_SITE_OTHER): Payer: BC Managed Care – PPO | Admitting: Adult Health

## 2022-12-02 ENCOUNTER — Other Ambulatory Visit (HOSPITAL_COMMUNITY): Payer: Self-pay

## 2022-12-08 DIAGNOSIS — R7303 Prediabetes: Secondary | ICD-10-CM | POA: Diagnosis not present

## 2022-12-08 DIAGNOSIS — Z6841 Body Mass Index (BMI) 40.0 and over, adult: Secondary | ICD-10-CM | POA: Diagnosis not present

## 2022-12-09 DIAGNOSIS — F4322 Adjustment disorder with anxiety: Secondary | ICD-10-CM | POA: Diagnosis not present

## 2022-12-22 ENCOUNTER — Other Ambulatory Visit (HOSPITAL_COMMUNITY): Payer: Self-pay

## 2022-12-30 DIAGNOSIS — N923 Ovulation bleeding: Secondary | ICD-10-CM | POA: Diagnosis not present

## 2022-12-30 DIAGNOSIS — D251 Intramural leiomyoma of uterus: Secondary | ICD-10-CM | POA: Diagnosis not present

## 2023-01-06 DIAGNOSIS — F4322 Adjustment disorder with anxiety: Secondary | ICD-10-CM | POA: Diagnosis not present

## 2023-01-09 ENCOUNTER — Other Ambulatory Visit (HOSPITAL_COMMUNITY): Payer: Self-pay

## 2023-01-15 DIAGNOSIS — Z6841 Body Mass Index (BMI) 40.0 and over, adult: Secondary | ICD-10-CM | POA: Diagnosis not present

## 2023-01-15 DIAGNOSIS — E282 Polycystic ovarian syndrome: Secondary | ICD-10-CM | POA: Diagnosis not present

## 2023-01-15 DIAGNOSIS — E038 Other specified hypothyroidism: Secondary | ICD-10-CM | POA: Diagnosis not present

## 2023-01-19 ENCOUNTER — Other Ambulatory Visit (HOSPITAL_COMMUNITY): Payer: Self-pay

## 2023-01-19 DIAGNOSIS — E282 Polycystic ovarian syndrome: Secondary | ICD-10-CM | POA: Diagnosis not present

## 2023-01-19 DIAGNOSIS — I10 Essential (primary) hypertension: Secondary | ICD-10-CM | POA: Diagnosis not present

## 2023-01-19 DIAGNOSIS — R7301 Impaired fasting glucose: Secondary | ICD-10-CM | POA: Diagnosis not present

## 2023-01-19 MED ORDER — WEGOVY 0.5 MG/0.5ML ~~LOC~~ SOAJ
0.5000 mg | SUBCUTANEOUS | 5 refills | Status: DC
  Filled 2023-01-19 (×2): qty 2, 28d supply, fill #0

## 2023-01-30 DIAGNOSIS — N951 Menopausal and female climacteric states: Secondary | ICD-10-CM | POA: Diagnosis not present

## 2023-01-30 DIAGNOSIS — Z6841 Body Mass Index (BMI) 40.0 and over, adult: Secondary | ICD-10-CM | POA: Diagnosis not present

## 2023-01-30 DIAGNOSIS — E282 Polycystic ovarian syndrome: Secondary | ICD-10-CM | POA: Diagnosis not present

## 2023-02-09 DIAGNOSIS — F4322 Adjustment disorder with anxiety: Secondary | ICD-10-CM | POA: Diagnosis not present

## 2023-02-10 DIAGNOSIS — H6691 Otitis media, unspecified, right ear: Secondary | ICD-10-CM | POA: Diagnosis not present

## 2023-02-12 DIAGNOSIS — J069 Acute upper respiratory infection, unspecified: Secondary | ICD-10-CM | POA: Diagnosis not present

## 2023-02-12 DIAGNOSIS — R059 Cough, unspecified: Secondary | ICD-10-CM | POA: Diagnosis not present

## 2023-02-23 DIAGNOSIS — F4322 Adjustment disorder with anxiety: Secondary | ICD-10-CM | POA: Diagnosis not present

## 2023-02-25 DIAGNOSIS — M67472 Ganglion, left ankle and foot: Secondary | ICD-10-CM | POA: Diagnosis not present

## 2023-03-07 ENCOUNTER — Other Ambulatory Visit: Payer: Self-pay | Admitting: Obstetrics and Gynecology

## 2023-03-09 DIAGNOSIS — F4322 Adjustment disorder with anxiety: Secondary | ICD-10-CM | POA: Diagnosis not present

## 2023-03-17 ENCOUNTER — Encounter (HOSPITAL_BASED_OUTPATIENT_CLINIC_OR_DEPARTMENT_OTHER): Payer: Self-pay | Admitting: Obstetrics and Gynecology

## 2023-03-17 DIAGNOSIS — N898 Other specified noninflammatory disorders of vagina: Secondary | ICD-10-CM | POA: Diagnosis not present

## 2023-03-17 DIAGNOSIS — B3731 Acute candidiasis of vulva and vagina: Secondary | ICD-10-CM | POA: Diagnosis not present

## 2023-03-17 NOTE — Progress Notes (Signed)
Spoke w/ via phone for pre-op interview--- [facility] needs dos----  EKG and ISTAT per anesthesia, CBC and UPT per surgeon.              Lab results------ COVID test -----patient states asymptomatic no test needed Arrive at -------0645 NPO after MN NO Solid Food.   Med rec completed Medications to take morning of surgery -----NONE Diabetic medication ----- Patient instructed no nail polish to be worn day of surgery Patient instructed to bring photo id and insurance card day of surgery Patient aware to have Driver (ride ) / caregiver  Mother Camila Li  for 24 hours after surgery  Patient Special Instructions ----- Pre-Op special Instructions ----- Last dose of Wegovy Preop should be June 5th, 2024. Patient verbalized understanding. Patient verbalized understanding of instructions that were given at this phone interview. Patient denies shortness of breath, chest pain, fever, cough at this phone interview.

## 2023-03-30 DIAGNOSIS — Z1231 Encounter for screening mammogram for malignant neoplasm of breast: Secondary | ICD-10-CM | POA: Diagnosis not present

## 2023-04-01 DIAGNOSIS — F4322 Adjustment disorder with anxiety: Secondary | ICD-10-CM | POA: Diagnosis not present

## 2023-04-03 ENCOUNTER — Ambulatory Visit (HOSPITAL_BASED_OUTPATIENT_CLINIC_OR_DEPARTMENT_OTHER)
Admission: RE | Admit: 2023-04-03 | Discharge: 2023-04-03 | Disposition: A | Discharge status: Home / Self Care | Payer: BC Managed Care – PPO | Attending: Obstetrics and Gynecology | Admitting: Obstetrics and Gynecology

## 2023-04-03 ENCOUNTER — Encounter (HOSPITAL_BASED_OUTPATIENT_CLINIC_OR_DEPARTMENT_OTHER): Payer: Self-pay | Admitting: Obstetrics and Gynecology

## 2023-04-03 ENCOUNTER — Encounter (HOSPITAL_BASED_OUTPATIENT_CLINIC_OR_DEPARTMENT_OTHER)
Admission: RE | Disposition: A | Discharge status: Home / Self Care | Payer: Self-pay | Source: Home / Self Care | Attending: Obstetrics and Gynecology

## 2023-04-03 ENCOUNTER — Other Ambulatory Visit: Payer: Self-pay

## 2023-04-03 ENCOUNTER — Ambulatory Visit (HOSPITAL_BASED_OUTPATIENT_CLINIC_OR_DEPARTMENT_OTHER): Payer: BC Managed Care – PPO | Admitting: Anesthesiology

## 2023-04-03 DIAGNOSIS — R7303 Prediabetes: Secondary | ICD-10-CM

## 2023-04-03 DIAGNOSIS — Z6841 Body Mass Index (BMI) 40.0 and over, adult: Secondary | ICD-10-CM | POA: Insufficient documentation

## 2023-04-03 DIAGNOSIS — D251 Intramural leiomyoma of uterus: Secondary | ICD-10-CM | POA: Diagnosis not present

## 2023-04-03 DIAGNOSIS — Z79899 Other long term (current) drug therapy: Secondary | ICD-10-CM | POA: Insufficient documentation

## 2023-04-03 DIAGNOSIS — N923 Ovulation bleeding: Secondary | ICD-10-CM | POA: Diagnosis not present

## 2023-04-03 DIAGNOSIS — N858 Other specified noninflammatory disorders of uterus: Secondary | ICD-10-CM | POA: Diagnosis not present

## 2023-04-03 DIAGNOSIS — N840 Polyp of corpus uteri: Secondary | ICD-10-CM | POA: Insufficient documentation

## 2023-04-03 DIAGNOSIS — M1990 Unspecified osteoarthritis, unspecified site: Secondary | ICD-10-CM | POA: Diagnosis not present

## 2023-04-03 DIAGNOSIS — D250 Submucous leiomyoma of uterus: Secondary | ICD-10-CM | POA: Diagnosis not present

## 2023-04-03 DIAGNOSIS — I10 Essential (primary) hypertension: Secondary | ICD-10-CM | POA: Insufficient documentation

## 2023-04-03 HISTORY — DX: Sleep apnea, unspecified: G47.30

## 2023-04-03 HISTORY — PX: HYSTEROSCOPY WITH D & C: SHX1775

## 2023-04-03 LAB — CBC
HCT: 41.2 % (ref 36.0–46.0)
Hemoglobin: 12.6 g/dL (ref 12.0–15.0)
MCH: 25.7 pg — ABNORMAL LOW (ref 26.0–34.0)
MCHC: 30.6 g/dL (ref 30.0–36.0)
MCV: 83.9 fL (ref 80.0–100.0)
Platelets: 231 10*3/uL (ref 150–400)
RBC: 4.91 MIL/uL (ref 3.87–5.11)
RDW: 13.9 % (ref 11.5–15.5)
WBC: 4.5 10*3/uL (ref 4.0–10.5)
nRBC: 0 % (ref 0.0–0.2)

## 2023-04-03 LAB — POCT I-STAT, CHEM 8
BUN: 21 mg/dL — ABNORMAL HIGH (ref 6–20)
Calcium, Ion: 1.2 mmol/L (ref 1.15–1.40)
Chloride: 103 mmol/L (ref 98–111)
Creatinine, Ser: 0.8 mg/dL (ref 0.44–1.00)
Glucose, Bld: 98 mg/dL (ref 70–99)
HCT: 41 % (ref 36.0–46.0)
Hemoglobin: 13.9 g/dL (ref 12.0–15.0)
Potassium: 4.2 mmol/L (ref 3.5–5.1)
Sodium: 140 mmol/L (ref 135–145)
TCO2: 30 mmol/L (ref 22–32)

## 2023-04-03 LAB — POCT PREGNANCY, URINE: Preg Test, Ur: NEGATIVE

## 2023-04-03 SURGERY — DILATATION AND CURETTAGE /HYSTEROSCOPY
Anesthesia: General

## 2023-04-03 MED ORDER — KETOROLAC TROMETHAMINE 30 MG/ML IJ SOLN
INTRAMUSCULAR | Status: DC | PRN
  Administered 2023-04-03: 30 mg via INTRAVENOUS

## 2023-04-03 MED ORDER — SODIUM CHLORIDE 0.9 % IR SOLN
Status: DC | PRN
  Administered 2023-04-03: 3000 mL

## 2023-04-03 MED ORDER — IBUPROFEN 800 MG PO TABS: 800.0000 mg | ORAL_TABLET | Freq: Three times a day (TID) | ORAL | 5 refills | Status: AC | PRN

## 2023-04-03 MED ORDER — MIDAZOLAM HCL 2 MG/2ML IJ SOLN
INTRAMUSCULAR | Status: AC
  Filled 2023-04-03: qty 2

## 2023-04-03 MED ORDER — ACETAMINOPHEN 500 MG PO TABS: 1000.0000 mg | ORAL_TABLET | Freq: Once | ORAL | Status: DC | PRN

## 2023-04-03 MED ORDER — FENTANYL CITRATE (PF) 100 MCG/2ML IJ SOLN
INTRAMUSCULAR | Status: AC
  Filled 2023-04-03: qty 2

## 2023-04-03 MED ORDER — POVIDONE-IODINE 10 % EX SWAB: 2.0000 | Freq: Once | CUTANEOUS | Status: DC

## 2023-04-03 MED ORDER — MIDAZOLAM HCL 2 MG/2ML IJ SOLN
INTRAMUSCULAR | Status: DC | PRN
  Administered 2023-04-03: 2 mg via INTRAVENOUS

## 2023-04-03 MED ORDER — ACETAMINOPHEN 160 MG/5ML PO SOLN: 1000.0000 mg | Freq: Once | ORAL | Status: DC | PRN

## 2023-04-03 MED ORDER — LIDOCAINE HCL (PF) 2 % IJ SOLN
INTRAMUSCULAR | Status: AC
  Filled 2023-04-03: qty 5

## 2023-04-03 MED ORDER — LIDOCAINE 2% (20 MG/ML) 5 ML SYRINGE
INTRAMUSCULAR | Status: DC | PRN
  Administered 2023-04-03: 40 mg via INTRAVENOUS

## 2023-04-03 MED ORDER — PROPOFOL 10 MG/ML IV BOLUS
INTRAVENOUS | Status: AC
  Filled 2023-04-03: qty 20

## 2023-04-03 MED ORDER — ONDANSETRON HCL 4 MG/2ML IJ SOLN
INTRAMUSCULAR | Status: DC | PRN
  Administered 2023-04-03: 4 mg via INTRAVENOUS

## 2023-04-03 MED ORDER — FENTANYL CITRATE (PF) 100 MCG/2ML IJ SOLN: 25.0000 ug | INTRAMUSCULAR | Status: DC | PRN

## 2023-04-03 MED ORDER — FENTANYL CITRATE (PF) 100 MCG/2ML IJ SOLN
25.0000 ug | INTRAMUSCULAR | Status: DC | PRN
  Administered 2023-04-03: 50 ug via INTRAVENOUS

## 2023-04-03 MED ORDER — ACETAMINOPHEN 10 MG/ML IV SOLN: 1000.0000 mg | Freq: Once | INTRAVENOUS | Status: DC | PRN

## 2023-04-03 MED ORDER — FENTANYL CITRATE (PF) 100 MCG/2ML IJ SOLN
INTRAMUSCULAR | Status: DC | PRN
  Administered 2023-04-03 (×2): 50 ug via INTRAVENOUS

## 2023-04-03 MED ORDER — PROPOFOL 10 MG/ML IV BOLUS
INTRAVENOUS | Status: DC | PRN
  Administered 2023-04-03: 160 mg via INTRAVENOUS

## 2023-04-03 MED ORDER — LACTATED RINGERS IV SOLN: INTRAVENOUS | Status: DC

## 2023-04-03 MED ORDER — DEXAMETHASONE SODIUM PHOSPHATE 10 MG/ML IJ SOLN
INTRAMUSCULAR | Status: DC | PRN
  Administered 2023-04-03: 10 mg via INTRAVENOUS

## 2023-04-03 SURGICAL SUPPLY — 22 items
CATH ROBINSON RED A/P 16FR (CATHETERS) IMPLANT
DEVICE MYOSURE LITE (MISCELLANEOUS) IMPLANT
DEVICE MYOSURE REACH (MISCELLANEOUS) IMPLANT
DILATOR CANAL MILEX (MISCELLANEOUS) IMPLANT
DRSG TELFA 3X8 NADH STRL (GAUZE/BANDAGES/DRESSINGS) ×1 IMPLANT
GAUZE 4X4 16PLY ~~LOC~~+RFID DBL (SPONGE) ×1 IMPLANT
GLOVE BIOGEL PI IND STRL 7.0 (GLOVE) ×1 IMPLANT
GLOVE ECLIPSE 6.5 STRL STRAW (GLOVE) ×1 IMPLANT
GOWN STRL REUS W/TWL LRG LVL3 (GOWN DISPOSABLE) ×1 IMPLANT
IV NS IRRIG 3000ML ARTHROMATIC (IV SOLUTION) ×1 IMPLANT
KIT PROCEDURE FLUENT (KITS) ×1 IMPLANT
KIT TURNOVER CYSTO (KITS) ×1 IMPLANT
MYOSURE XL FIBROID (MISCELLANEOUS)
PACK VAGINAL MINOR WOMEN LF (CUSTOM PROCEDURE TRAY) ×1 IMPLANT
PAD OB MATERNITY 4.3X12.25 (PERSONAL CARE ITEMS) ×1 IMPLANT
PAD PREP 24X48 CUFFED NSTRL (MISCELLANEOUS) ×1 IMPLANT
SEAL CERVICAL OMNI LOK (ABLATOR) IMPLANT
SEAL ROD LENS SCOPE MYOSURE (ABLATOR) ×1 IMPLANT
SLEEVE SCD COMPRESS KNEE MED (STOCKING) ×1 IMPLANT
SYSTEM TISS REMOVAL MYOSURE XL (MISCELLANEOUS) IMPLANT
TOWEL OR 17X24 6PK STRL BLUE (TOWEL DISPOSABLE) ×1 IMPLANT
WATER STERILE IRR 500ML POUR (IV SOLUTION) ×1 IMPLANT

## 2023-04-03 NOTE — Op Note (Signed)
[name], [name] MEDICAL RECORD NO: [id number] ACCOUNT NO: [id number] DATE OF BIRTH: Feb 21, 1971 FACILITY: [facility] LOCATION: [facility] PHYSICIAN: Mackenzie Lia A. Cherly Hensen, MD  Operative Report   DATE OF PROCEDURE: 04/03/2023  PREOPERATIVE DIAGNOSES:  Intermenstrual bleeding, uterine fibroids.  PROCEDURE:  A diagnostic hysteroscopy, hysteroscopic resection of endometrial polyp and submucosal fibroid resection using MyoSure, dilation and curettage.  POSTOPERATIVE DIAGNOSES:  Intermenstrual bleeding, endometrial polyp, submucosal fibroid.  ANESTHESIA:  General.  SURGEON:  Martino Tompson A. Cherly Hensen, MD.  ASSISTANT:  None.  DESCRIPTION OF PROCEDURE:  Under adequate general anesthesia, the patient was placed in the dorsal lithotomy position.  She was sterilely prepped and draped in the usual fashion.  The bladder was not catheterized as the patient voided prior to entering  the room.  Examination under anesthesia revealed anteverted uterus, no adnexal masses could be appreciated.  Bivalve speculum was placed in the vagina.  A single-tooth tenaculum was placed on the anterior lip of the cervix.  Cervix was then serially  dilated up to #[facility] dilator.  A diagnostic hysteroscope was introduced into the uterine cavity.  Both tubal ostia could be seen.  Right lateral wall had an endometrial polyp and 2 posterior side by side submucosal fibroids was noted.  Using the Reach  resectoscope, the endometrial polyp, endometrium and submucosal fibroids were all resected.  The endometrial wall was also resected. The endocervical canal was inspected and no lesions noted.  When all tissue was felt to have been resected and removed,  all instruments were then removed from the vagina.  Specimen labeled endometrial curettings with polyp and submucosal fibroid resection was sent to pathology.  ESTIMATED BLOOD LOSS:  20 mL.  FLUID DEFICIT:  325 mL  INTRAOPERATIVE FLUIDS:  1 liter.  COMPLICATIONS:  None.  The  patient tolerated the procedure well and was transferred to recovery in stable condition.   PUS D: 04/03/2023 2:34:47 pm T: 04/03/2023 5:22:00 pm  JOB: [id number]/ [id number]

## 2023-04-03 NOTE — Transfer of Care (Signed)
Immediate Anesthesia Transfer of Care Note  Patient: Brittany Mills  Procedure(s) Performed: Procedure(s) (LRB): DILATATION AND CURETTAGE /HYSTEROSCOPY Myosure (N/A)  Patient Location: PACU  Anesthesia Type: General  Level of Consciousness: awake, oriented, sedated and patient cooperative  Airway & Oxygen Therapy: Patient Spontanous Breathing and Patient connected to face mask oxygen  Post-op Assessment: Report given to PACU RN and Post -op Vital signs reviewed and stable  Post vital signs: Reviewed and stable  Complications: No apparent anesthesia complications Last Vitals:  Vitals Value Taken Time  BP 162/96 04/03/23 0926  Temp    Pulse 74 04/03/23 0927  Resp 20 04/03/23 0928  SpO2 100 % 04/03/23 0927  Vitals shown include unvalidated device data.  Last Pain:  Vitals:   04/03/23 0708  TempSrc: Oral  PainSc: 2       Patients Stated Pain Goal: 2 (04/03/23 0708)  Complications: No notable events documented.

## 2023-04-03 NOTE — Interval H&P Note (Signed)
History and Physical Interval Note:  04/03/2023 8:30 AM  Brittany Mills  has presented today for surgery, with the diagnosis of Abnormal uterine bleeding.  The various methods of treatment have been discussed with the patient and family. After consideration of risks, benefits and other options for treatment, the patient has consented to  PROCEDURES: DAIGNOSTIC HYSTEROSCOPY, HYSTEROSCOPIC RESECTION OF UTERINE FIBROID USING MYOSURE, DILATION AND CURETTAGE as a surgical intervention.  The patient's history has been reviewed, patient examined, no change in status, stable for surgery.  I have reviewed the patient's chart and labs.  Questions were answered to the patient's satisfaction.     Anastacio Bua A Dammon Makarewicz

## 2023-04-03 NOTE — Anesthesia Procedure Notes (Signed)
Procedure Name: LMA Insertion Date/Time: 04/03/2023 8:43 AM  Performed by: Francie Massing, CRNAPre-anesthesia Checklist: Patient identified, Emergency Drugs available, Suction available and Patient being monitored Patient Re-evaluated:Patient Re-evaluated prior to induction Oxygen Delivery Method: Circle system utilized Preoxygenation: Pre-oxygenation with 100% oxygen Induction Type: IV induction Ventilation: Mask ventilation without difficulty LMA: LMA inserted LMA Size: 4.0 Number of attempts: 1 Airway Equipment and Method: Bite block Placement Confirmation: positive ETCO2 Tube secured with: Tape Dental Injury: Teeth and Oropharynx as per pre-operative assessment

## 2023-04-03 NOTE — Discharge Instructions (Addendum)
CALL  IF TEMP>100.4, NOTHING PER VAGINA X 2 WK, CALL IF SOAKING A MAXI  PAD EVERY HOUR OR MORE FREQUENTLY        No ibuprofen, Advil, Aleve, Motrin, ketorolac, meloxicam, naproxen, or other NSAIDS until after 3:15 pm today if needed.     Post Anesthesia Home Care Instructions  Activity: Get plenty of rest for the remainder of the day. A responsible individual must stay with you for 24 hours following the procedure.  For the next 24 hours, DO NOT: -Drive a car -Advertising copywriter -Drink alcoholic beverages -Take any medication unless instructed by your physician -Make any legal decisions or sign important papers.  Meals: Start with liquid foods such as gelatin or soup. Progress to regular foods as tolerated. Avoid greasy, spicy, heavy foods. If nausea and/or vomiting occur, drink only clear liquids until the nausea and/or vomiting subsides. Call your physician if vomiting continues.  Special Instructions/Symptoms: Your throat may feel dry or sore from the anesthesia or the breathing tube placed in your throat during surgery. If this causes discomfort, gargle with warm salt water. The discomfort should disappear within 24 hours.

## 2023-04-03 NOTE — Brief Op Note (Signed)
04/03/2023  9:24 AM  PATIENT:  Brittany Mills  52 y.o. female  PRE-OPERATIVE DIAGNOSIS:  IMB,  Intramural and submucosal fibroids  POST-OPERATIVE DIAGNOSIS:  IMB, SM fibroid, endometrial polyp  PROCEDURE:  diagnostic hysteroscopy, hysteroscopic resection of SM fibroid and endometrial polyp using myosure, dilation and curettage  SURGEON:  Surgeon(s) and Role:    * Maxie Better, MD - Primary  PHYSICIAN ASSISTANT:   ASSISTANTS: none   ANESTHESIA:   general FINDINGS: RIGHT LATERAL  ENDOMETRIAL POLYP. POSTERIOR SUBMUCOSAL FIBROIDS EBL:  20 mL   BLOOD ADMINISTERED:none  DRAINS: none   LOCAL MEDICATIONS USED:  NONE  SPECIMEN:  Source of Specimen:  EMC with polyp and fibroid resection  DISPOSITION OF SPECIMEN:  PATHOLOGY  COUNTS:  YES  TOURNIQUET:  * No tourniquets in log *  DICTATION: .Other Dictation: Dictation Number [id number]  PLAN OF CARE: Discharge to home after PACU  PATIENT DISPOSITION:  PACU - hemodynamically stable.   Delay start of Pharmacological VTE agent (>24hrs) due to surgical blood loss or risk of bleeding: no

## 2023-04-03 NOTE — H&P (Signed)
Brittany Mills is an 52 y.o. female. [id number] Separated female presents for surgical mgmt of IMB. Pt has had sonogram which showed IM and SM fibroid. She has had EBX which was benign  Pertinent Gynecological History: Menses: regular every month with spotting approximately 2-3 days per month Bleeding: intermenstrual bleeding Contraception: none DES exposure: denies Blood transfusions: none Sexually transmitted diseases: no past history Previous GYN Procedures:  dx hysteroscopy, fibroid resection   Last mammogram: normal Date: 2023 Last pap: normal Date: 2023 OB History: G2, P1   Menstrual History: Menarche age: n/a Patient's last menstrual period was 02/19/2023 (exact date).    Past Medical History:  Diagnosis Date   Arthritis    lower back - no meds   Hypertension    Joint pain    Osteoarthritis    PCOS (polycystic ovarian syndrome)    PMS (premenstrual syndrome)    tx prozac   Pre-diabetes    no meds   Sleep apnea     Past Surgical History:  Procedure Laterality Date   BREAST SURGERY     Reduction   CESAREAN SECTION     x 1   COLONOSCOPY     normal   DILATATION & CURETTAGE/HYSTEROSCOPY WITH MYOSURE N/A 04/09/2017   Procedure: DILATATION & CURETTAGE/HYSTEROSCOPY WITH MYOSURE;  Surgeon: Maxie Better, MD;  Location: [facility];  Service: Gynecology;  Laterality: N/A;   LEEP      Family History  Problem Relation Age of Onset   High blood pressure Mother    Other Mother        pacemaker   Heart failure Mother    Hypertension Mother    Hyperlipidemia Mother    Thyroid disease Mother    Cancer Mother    Sleep apnea Mother    Obesity Mother    Colon cancer Father    Schizophrenia Father    Hypertension Sister    Diabetes Brother    High blood pressure Brother    High blood pressure Brother    Colon cancer Maternal Grandmother    Heart attack Maternal Grandfather     Social History:  reports that she has never smoked. She has never used smokeless tobacco.  She reports current alcohol use of about 5.0 standard drinks of alcohol per week. She reports that she does not use drugs.  Allergies:  Allergies  Allergen Reactions   Glucophage [Metformin Hydrochloride] Rash   Latex Rash    Slight rash on face   Oxycodone Rash   Terbinafine And Related Rash    No medications prior to admission.    Review of Systems  All other systems reviewed and are negative.   Height 5\' 5"  (1.651 m), weight 129.3 kg, last menstrual period 02/19/2023. Physical Exam Constitutional:      Appearance: She is obese.  HENT:     Head: Atraumatic.  Eyes:     Extraocular Movements: Extraocular movements intact.  Cardiovascular:     Rate and Rhythm: Regular rhythm.     Heart sounds: Normal heart sounds.  Abdominal:     Palpations: Abdomen is soft.     Comments: Obese. Pfannensteil scar  Genitourinary:    General: Normal vulva.     Comments: Vagina nl  Cervix nulliparous Uterus AV Adnexa nl Musculoskeletal:        General: Normal range of motion.  Skin:    General: Skin is warm and dry.  Neurological:     General: No focal deficit present.     Mental  Status: She is alert.  Psychiatric:        Mood and Affect: Mood normal.        Behavior: Behavior normal.     No results found for this or any previous visit (from the past 24 hour(s)).  No results found.  Assessment/Plan: IMB Intramural/SM/SS fibroids P) dx hysteroscopy, hysteroscopic resection of SM fibroid using Myosure, D&C. Procedure explained. Risk of surgery reviewed including infection, bleeding, injury to surrounding organ structure, thermal injury, fluid overload and its mgmt, uterine perforation ( 10/998) and its risk). All ? answered  Xaniyah Buchholz A Aylan Bayona 04/03/2023, 4:50 AM

## 2023-04-03 NOTE — Anesthesia Preprocedure Evaluation (Addendum)
Anesthesia Evaluation  Patient identified by MRN, date of birth, ID band Patient awake    Reviewed: Allergy & Precautions, NPO status , Patient's Chart, lab work & pertinent test results  Airway Mallampati: III  TM Distance: >3 FB Neck ROM: Full    Dental  (+) Teeth Intact, Dental Advisory Given   Pulmonary neg shortness of breath, sleep apnea , neg COPD, neg recent URI   breath sounds clear to auscultation       Cardiovascular hypertension, Pt. on medications (-) angina (-) Past MI and (-) CHF  Rhythm:Regular     Neuro/Psych negative neurological ROS  negative psych ROS   GI/Hepatic negative GI ROS, Neg liver ROS,,,  Endo/Other    Morbid obesity  Renal/GU negative Renal ROS  negative genitourinary   Musculoskeletal  (+) Arthritis , Osteoarthritis,    Abdominal   Peds negative pediatric ROS (+)  Hematology negative hematology ROS (+)   Anesthesia Other Findings Day of surgery medications reviewed with the patient.  Reproductive/Obstetrics negative OB ROS                             Lab Results  Component Value Date   WBC 6.7 04/01/2017   HGB 12.9 04/01/2017   HCT 40.1 04/01/2017   MCV 82.0 04/01/2017   PLT 210 04/01/2017   Lab Results  Component Value Date   CREATININE 0.64 02/18/2022   BUN 19 02/18/2022   NA 141 02/18/2022   K 4.4 02/18/2022   CL 105 02/18/2022   CO2 25 02/18/2022   No results found for: "INR", "PROTIME"  03/2017 EKG: NSR  Anesthesia Physical Anesthesia Plan  ASA: 3  Anesthesia Plan: General   Post-op Pain Management:    Induction: Intravenous  PONV Risk Score and Plan: 4 or greater and Ondansetron, Dexamethasone and Midazolam  Airway Management Planned: LMA and Oral ETT  Additional Equipment: None  Intra-op Plan:   Post-operative Plan: Extubation in OR  Informed Consent: I have reviewed the patients History and Physical, chart, labs and  discussed the procedure including the risks, benefits and alternatives for the proposed anesthesia with the patient or authorized representative who has indicated his/her understanding and acceptance.     Dental advisory given  Plan Discussed with: CRNA  Anesthesia Plan Comments:         Anesthesia Quick Evaluation

## 2023-04-06 ENCOUNTER — Encounter (HOSPITAL_BASED_OUTPATIENT_CLINIC_OR_DEPARTMENT_OTHER): Payer: Self-pay | Admitting: Obstetrics and Gynecology

## 2023-04-06 LAB — SURGICAL PATHOLOGY

## 2023-04-06 NOTE — Anesthesia Postprocedure Evaluation (Signed)
Anesthesia Post Note  Patient: Brittany Mills  Procedure(s) Performed: DILATATION AND CURETTAGE /HYSTEROSCOPY Myosure     Patient location during evaluation: PACU Anesthesia Type: General Level of consciousness: awake and alert Pain management: pain level controlled Vital Signs Assessment: post-procedure vital signs reviewed and stable Respiratory status: spontaneous breathing, nonlabored ventilation, respiratory function stable and patient connected to nasal cannula oxygen Cardiovascular status: blood pressure returned to baseline and stable Postop Assessment: no apparent nausea or vomiting Anesthetic complications: no   No notable events documented.  Last Vitals:  Vitals:   04/03/23 1030 04/03/23 1058  BP: (!) 148/88 136/87  Pulse: 62 65  Resp: 18 14  Temp:  36.4 C  SpO2: 100% 98%    Last Pain:  Vitals:   04/03/23 1058  TempSrc:   PainSc: 0-No pain   Pain Goal: Patients Stated Pain Goal: 4 (04/03/23 1030)                 Addilynn Mowrer

## 2023-04-07 DIAGNOSIS — Z6841 Body Mass Index (BMI) 40.0 and over, adult: Secondary | ICD-10-CM | POA: Diagnosis not present

## 2023-04-07 DIAGNOSIS — E282 Polycystic ovarian syndrome: Secondary | ICD-10-CM | POA: Diagnosis not present

## 2023-04-14 DIAGNOSIS — Z01419 Encounter for gynecological examination (general) (routine) without abnormal findings: Secondary | ICD-10-CM | POA: Diagnosis not present

## 2023-04-20 DIAGNOSIS — R7301 Impaired fasting glucose: Secondary | ICD-10-CM | POA: Diagnosis not present

## 2023-04-20 DIAGNOSIS — R635 Abnormal weight gain: Secondary | ICD-10-CM | POA: Diagnosis not present

## 2023-04-20 DIAGNOSIS — F4322 Adjustment disorder with anxiety: Secondary | ICD-10-CM | POA: Diagnosis not present

## 2023-04-21 DIAGNOSIS — E782 Mixed hyperlipidemia: Secondary | ICD-10-CM | POA: Diagnosis not present

## 2023-04-21 DIAGNOSIS — Z6841 Body Mass Index (BMI) 40.0 and over, adult: Secondary | ICD-10-CM | POA: Diagnosis not present

## 2023-04-21 DIAGNOSIS — I10 Essential (primary) hypertension: Secondary | ICD-10-CM | POA: Diagnosis not present

## 2023-04-27 DIAGNOSIS — I10 Essential (primary) hypertension: Secondary | ICD-10-CM | POA: Diagnosis not present

## 2023-04-27 DIAGNOSIS — R635 Abnormal weight gain: Secondary | ICD-10-CM | POA: Diagnosis not present

## 2023-04-27 DIAGNOSIS — E282 Polycystic ovarian syndrome: Secondary | ICD-10-CM | POA: Diagnosis not present

## 2023-04-27 DIAGNOSIS — R7301 Impaired fasting glucose: Secondary | ICD-10-CM | POA: Diagnosis not present

## 2023-05-05 DIAGNOSIS — F4322 Adjustment disorder with anxiety: Secondary | ICD-10-CM | POA: Diagnosis not present

## 2023-05-08 DIAGNOSIS — Z6841 Body Mass Index (BMI) 40.0 and over, adult: Secondary | ICD-10-CM | POA: Diagnosis not present

## 2023-05-08 DIAGNOSIS — I10 Essential (primary) hypertension: Secondary | ICD-10-CM | POA: Diagnosis not present

## 2023-05-18 DIAGNOSIS — F4322 Adjustment disorder with anxiety: Secondary | ICD-10-CM | POA: Diagnosis not present

## 2023-05-25 DIAGNOSIS — R7303 Prediabetes: Secondary | ICD-10-CM | POA: Diagnosis not present

## 2023-05-25 DIAGNOSIS — Z6841 Body Mass Index (BMI) 40.0 and over, adult: Secondary | ICD-10-CM | POA: Diagnosis not present

## 2023-06-08 DIAGNOSIS — R232 Flushing: Secondary | ICD-10-CM | POA: Diagnosis not present

## 2023-06-08 DIAGNOSIS — E782 Mixed hyperlipidemia: Secondary | ICD-10-CM | POA: Diagnosis not present

## 2023-06-08 DIAGNOSIS — E038 Other specified hypothyroidism: Secondary | ICD-10-CM | POA: Diagnosis not present

## 2023-06-09 DIAGNOSIS — F4322 Adjustment disorder with anxiety: Secondary | ICD-10-CM | POA: Diagnosis not present

## 2023-06-23 DIAGNOSIS — F4322 Adjustment disorder with anxiety: Secondary | ICD-10-CM | POA: Diagnosis not present

## 2023-06-25 DIAGNOSIS — Z6841 Body Mass Index (BMI) 40.0 and over, adult: Secondary | ICD-10-CM | POA: Diagnosis not present

## 2023-06-25 DIAGNOSIS — E038 Other specified hypothyroidism: Secondary | ICD-10-CM | POA: Diagnosis not present

## 2023-06-25 DIAGNOSIS — E782 Mixed hyperlipidemia: Secondary | ICD-10-CM | POA: Diagnosis not present

## 2023-06-25 DIAGNOSIS — I10 Essential (primary) hypertension: Secondary | ICD-10-CM | POA: Diagnosis not present

## 2023-06-29 DIAGNOSIS — M1711 Unilateral primary osteoarthritis, right knee: Secondary | ICD-10-CM | POA: Diagnosis not present

## 2023-07-03 DIAGNOSIS — I10 Essential (primary) hypertension: Secondary | ICD-10-CM | POA: Diagnosis not present

## 2023-07-03 DIAGNOSIS — Z6841 Body Mass Index (BMI) 40.0 and over, adult: Secondary | ICD-10-CM | POA: Diagnosis not present

## 2023-07-07 DIAGNOSIS — Z6841 Body Mass Index (BMI) 40.0 and over, adult: Secondary | ICD-10-CM | POA: Diagnosis not present

## 2023-07-07 DIAGNOSIS — E782 Mixed hyperlipidemia: Secondary | ICD-10-CM | POA: Diagnosis not present

## 2023-07-07 DIAGNOSIS — E038 Other specified hypothyroidism: Secondary | ICD-10-CM | POA: Diagnosis not present

## 2023-07-14 DIAGNOSIS — I10 Essential (primary) hypertension: Secondary | ICD-10-CM | POA: Diagnosis not present

## 2023-07-20 DIAGNOSIS — F4322 Adjustment disorder with anxiety: Secondary | ICD-10-CM | POA: Diagnosis not present

## 2023-07-22 DIAGNOSIS — M1711 Unilateral primary osteoarthritis, right knee: Secondary | ICD-10-CM | POA: Diagnosis not present

## 2023-07-28 DIAGNOSIS — I10 Essential (primary) hypertension: Secondary | ICD-10-CM | POA: Diagnosis not present

## 2023-07-29 DIAGNOSIS — M1711 Unilateral primary osteoarthritis, right knee: Secondary | ICD-10-CM | POA: Diagnosis not present

## 2023-08-04 DIAGNOSIS — M1711 Unilateral primary osteoarthritis, right knee: Secondary | ICD-10-CM | POA: Diagnosis not present

## 2023-08-04 DIAGNOSIS — I10 Essential (primary) hypertension: Secondary | ICD-10-CM | POA: Diagnosis not present

## 2023-08-10 DIAGNOSIS — F4322 Adjustment disorder with anxiety: Secondary | ICD-10-CM | POA: Diagnosis not present

## 2023-08-11 DIAGNOSIS — I10 Essential (primary) hypertension: Secondary | ICD-10-CM | POA: Diagnosis not present

## 2023-08-18 DIAGNOSIS — I10 Essential (primary) hypertension: Secondary | ICD-10-CM | POA: Diagnosis not present

## 2023-08-24 DIAGNOSIS — M25562 Pain in left knee: Secondary | ICD-10-CM | POA: Diagnosis not present

## 2023-08-25 DIAGNOSIS — I10 Essential (primary) hypertension: Secondary | ICD-10-CM | POA: Diagnosis not present

## 2023-08-27 ENCOUNTER — Other Ambulatory Visit (HOSPITAL_COMMUNITY): Payer: Self-pay | Admitting: Medical

## 2023-08-27 ENCOUNTER — Ambulatory Visit (HOSPITAL_COMMUNITY)
Admission: RE | Admit: 2023-08-27 | Discharge: 2023-08-27 | Disposition: A | Discharge status: Home / Self Care | Payer: BC Managed Care – PPO | Source: Ambulatory Visit | Attending: Vascular Surgery | Admitting: Vascular Surgery

## 2023-08-27 DIAGNOSIS — M25562 Pain in left knee: Secondary | ICD-10-CM | POA: Insufficient documentation

## 2023-09-01 DIAGNOSIS — M1712 Unilateral primary osteoarthritis, left knee: Secondary | ICD-10-CM | POA: Diagnosis not present

## 2023-09-01 DIAGNOSIS — I10 Essential (primary) hypertension: Secondary | ICD-10-CM | POA: Diagnosis not present

## 2023-09-04 DIAGNOSIS — I10 Essential (primary) hypertension: Secondary | ICD-10-CM | POA: Diagnosis not present

## 2023-09-04 DIAGNOSIS — E282 Polycystic ovarian syndrome: Secondary | ICD-10-CM | POA: Diagnosis not present

## 2023-09-04 DIAGNOSIS — R635 Abnormal weight gain: Secondary | ICD-10-CM | POA: Diagnosis not present

## 2023-09-08 DIAGNOSIS — I10 Essential (primary) hypertension: Secondary | ICD-10-CM | POA: Diagnosis not present

## 2023-09-15 DIAGNOSIS — M170 Bilateral primary osteoarthritis of knee: Secondary | ICD-10-CM | POA: Diagnosis not present

## 2023-09-15 DIAGNOSIS — F4322 Adjustment disorder with anxiety: Secondary | ICD-10-CM | POA: Diagnosis not present

## 2023-09-16 DIAGNOSIS — M2550 Pain in unspecified joint: Secondary | ICD-10-CM | POA: Diagnosis not present

## 2023-09-16 DIAGNOSIS — R768 Other specified abnormal immunological findings in serum: Secondary | ICD-10-CM | POA: Diagnosis not present

## 2023-09-16 DIAGNOSIS — M1990 Unspecified osteoarthritis, unspecified site: Secondary | ICD-10-CM | POA: Diagnosis not present

## 2023-09-16 DIAGNOSIS — M549 Dorsalgia, unspecified: Secondary | ICD-10-CM | POA: Diagnosis not present

## 2023-09-25 DIAGNOSIS — S83242D Other tear of medial meniscus, current injury, left knee, subsequent encounter: Secondary | ICD-10-CM | POA: Diagnosis not present

## 2023-09-25 DIAGNOSIS — M1711 Unilateral primary osteoarthritis, right knee: Secondary | ICD-10-CM | POA: Diagnosis not present

## 2023-10-09 DIAGNOSIS — M25562 Pain in left knee: Secondary | ICD-10-CM | POA: Diagnosis not present

## 2023-12-16 ENCOUNTER — Other Ambulatory Visit (HOSPITAL_COMMUNITY): Payer: Self-pay | Admitting: Endocrinology

## 2023-12-22 ENCOUNTER — Other Ambulatory Visit (HOSPITAL_COMMUNITY): Payer: Self-pay

## 2023-12-25 ENCOUNTER — Ambulatory Visit (HOSPITAL_COMMUNITY)
Admission: RE | Admit: 2023-12-25 | Discharge: 2023-12-25 | Disposition: A | Discharge status: Home / Self Care | Payer: Self-pay | Source: Ambulatory Visit | Attending: Endocrinology | Admitting: Endocrinology

## 2024-03-10 ENCOUNTER — Other Ambulatory Visit: Payer: Self-pay | Admitting: Nurse Practitioner

## 2024-03-10 DIAGNOSIS — R1011 Right upper quadrant pain: Secondary | ICD-10-CM

## 2024-03-11 ENCOUNTER — Ambulatory Visit
Admission: RE | Admit: 2024-03-11 | Discharge: 2024-03-11 | Disposition: A | Discharge status: Home / Self Care | Source: Ambulatory Visit | Attending: Nurse Practitioner | Admitting: Nurse Practitioner

## 2024-03-11 DIAGNOSIS — R1011 Right upper quadrant pain: Secondary | ICD-10-CM
